# Patient Record
Sex: Female | Born: 1975 | Race: Black or African American | Hispanic: No | Marital: Married | Smoking: Never smoker
Health system: Southern US, Community
[De-identification: ages and names within clinical notes are randomized; demographics above are authoritative.]

## PROBLEM LIST (undated history)

## (undated) DIAGNOSIS — B9689 Other specified bacterial agents as the cause of diseases classified elsewhere: Secondary | ICD-10-CM

## (undated) DIAGNOSIS — I729 Aneurysm of unspecified site: Secondary | ICD-10-CM

## (undated) DIAGNOSIS — N390 Urinary tract infection, site not specified: Secondary | ICD-10-CM

## (undated) DIAGNOSIS — Q893 Situs inversus: Secondary | ICD-10-CM

## (undated) HISTORY — PX: BONE MARROW BIOPSY: SHX199

## (undated) HISTORY — PX: LYMPHADENECTOMY: SHX5960

## (undated) HISTORY — DX: Other specified bacterial agents as the cause of diseases classified elsewhere: B96.89

## (undated) HISTORY — DX: Urinary tract infection, site not specified: N39.0

---

## 2017-01-06 ENCOUNTER — Emergency Department (HOSPITAL_COMMUNITY): Payer: BC Managed Care – PPO

## 2017-01-06 ENCOUNTER — Inpatient Hospital Stay (HOSPITAL_COMMUNITY)
Admission: EM | Admit: 2017-01-06 | Discharge: 2017-01-22 | DRG: 021 | Disposition: A | Discharge status: Home / Self Care | Payer: BC Managed Care – PPO | Attending: Neurosurgery | Admitting: Neurosurgery

## 2017-01-06 DIAGNOSIS — R51 Headache: Secondary | ICD-10-CM

## 2017-01-06 DIAGNOSIS — I67848 Other cerebrovascular vasospasm and vasoconstriction: Secondary | ICD-10-CM | POA: Diagnosis not present

## 2017-01-06 DIAGNOSIS — I671 Cerebral aneurysm, nonruptured: Secondary | ICD-10-CM

## 2017-01-06 DIAGNOSIS — D62 Acute posthemorrhagic anemia: Secondary | ICD-10-CM | POA: Diagnosis present

## 2017-01-06 DIAGNOSIS — I6032 Nontraumatic subarachnoid hemorrhage from left posterior communicating artery: Secondary | ICD-10-CM | POA: Diagnosis not present

## 2017-01-06 DIAGNOSIS — I609 Nontraumatic subarachnoid hemorrhage, unspecified: Secondary | ICD-10-CM

## 2017-01-06 DIAGNOSIS — M545 Low back pain: Secondary | ICD-10-CM | POA: Diagnosis not present

## 2017-01-06 DIAGNOSIS — R402253 Coma scale, best verbal response, oriented, at hospital admission: Secondary | ICD-10-CM | POA: Diagnosis present

## 2017-01-06 DIAGNOSIS — R402143 Coma scale, eyes open, spontaneous, at hospital admission: Secondary | ICD-10-CM | POA: Diagnosis present

## 2017-01-06 DIAGNOSIS — M79606 Pain in leg, unspecified: Secondary | ICD-10-CM | POA: Diagnosis not present

## 2017-01-06 DIAGNOSIS — R519 Headache, unspecified: Secondary | ICD-10-CM

## 2017-01-06 DIAGNOSIS — Z01818 Encounter for other preprocedural examination: Secondary | ICD-10-CM

## 2017-01-06 DIAGNOSIS — J9690 Respiratory failure, unspecified, unspecified whether with hypoxia or hypercapnia: Secondary | ICD-10-CM

## 2017-01-06 DIAGNOSIS — Z79899 Other long term (current) drug therapy: Secondary | ICD-10-CM

## 2017-01-06 DIAGNOSIS — R402363 Coma scale, best motor response, obeys commands, at hospital admission: Secondary | ICD-10-CM | POA: Diagnosis present

## 2017-01-06 MED ORDER — SODIUM CHLORIDE 0.9 % IV BOLUS (SEPSIS)
1000.0000 mL | Freq: Once | INTRAVENOUS | Status: AC
  Administered 2017-01-07: 1000 mL via INTRAVENOUS

## 2017-01-06 MED ORDER — DIPHENHYDRAMINE HCL 50 MG/ML IJ SOLN
25.0000 mg | Freq: Once | INTRAMUSCULAR | Status: AC
  Administered 2017-01-07: 25 mg via INTRAVENOUS
  Filled 2017-01-06: qty 1

## 2017-01-06 MED ORDER — METOCLOPRAMIDE HCL 5 MG/ML IJ SOLN
10.0000 mg | Freq: Once | INTRAMUSCULAR | Status: AC
  Administered 2017-01-07: 10 mg via INTRAVENOUS
  Filled 2017-01-06: qty 2

## 2017-01-06 NOTE — ED Triage Notes (Signed)
Pt uncle says the pt has had headache since coming home from the gym this afternoon. Pt took antianxiety medication and tried to sleep the headache off but still had headache.

## 2017-01-06 NOTE — ED Provider Notes (Signed)
[facility]-EMERGENCY DEPT Provider Note   CSN: [id number] Arrival date & time: 01/06/17  2138  By signing my name below, I, Doreatha Martin, attest that this documentation has been prepared under the direction and in the presence of Devoria Albe, MD. Electronically Signed: Doreatha Martin, ED Scribe. 01/06/17. 11:35 PM.   Time seen 11:22 PM    History   Chief Complaint Chief Complaint  Patient presents with  . Headache    HPI Bonnie Tran is a 41 y.o. female who presents to the Emergency Department complaining of sudden onset, severe, throbbing HA that began at 5:45 PM this evening with associated nausea. Pt states her pain begins at her posterior neck and radiates through the occiput, parietal, frontal and temporal regions of her head. Per pt, she was doing shoulder presses at the gym, which she reports is a normal exercise for her however she hadn't done it in a month, when she suddenly began to experience an intense throbbing pain in her posterior neck/head. Pt states she had not done this exercise in over a month, and was straining with her normal 20 lb weights that she was doing before. She states her HA is worsened with retching, head movement, speaking, light and sound. Pt reports mild relief of pain with ice application to her posterior neck, pressure applied to her forehead, and no relief with 2 Clonopin. No h/o similar headaches or frequent headaches; no FHx of headaches. Pt is right hand dominant. She denies vomiting, blurred vision, numbness/tingling to the extremities.      PCPDarrow Bussing, MD with Deboraha Sprang   The history is provided by the patient. No language interpreter was used.    No past medical history on file.  There are no active problems to display for this patient.   No past surgical history on file.  OB History    No data available       Home Medications    Prior to Admission medications   Medication Sig Start Date End Date Taking? Authorizing Provider    amphetamine-dextroamphetamine (ADDERALL) 10 MG tablet Take 10 mg by mouth 2 (two) times daily with a meal.   Yes Historical Provider, MD  citalopram (CELEXA) 10 MG tablet Take 10 mg by mouth daily.   Yes Historical Provider, MD  clonazePAM (KLONOPIN) 1 MG tablet Take 0.5-1 mg by mouth at bedtime as needed for anxiety.   Yes Historical Provider, MD    Family History No family history on file.  Social History Social History  Substance Use Topics  . Smoking status: Not on file  . Smokeless tobacco: Not on file  . Alcohol use Not on file  lives with her uncle No smoking No alcohol   Allergies   Patient has no known allergies.   Review of Systems Review of Systems  Eyes: Positive for photophobia. Negative for visual disturbance.  Gastrointestinal: Positive for nausea. Negative for vomiting.  Musculoskeletal: Positive for neck pain.  Neurological: Positive for headaches. Negative for numbness.       No tingling  All other systems reviewed and are negative.   Physical Exam Updated Vital Signs BP 128/68 (BP Location: Right Arm)   Pulse 81   Temp 97.5 F (36.4 C) (Oral)   Resp 18   SpO2 97%  Vital signs normal     Physical Exam  Constitutional: She is oriented to person, place, and time. She appears well-developed and well-nourished.  Non-toxic appearance. She does not appear ill. She appears distressed.  Appears photophobic.  HENT:  Head: Normocephalic and atraumatic.  Right Ear: External ear normal.  Left Ear: External ear normal.  Nose: Nose normal. No mucosal edema or rhinorrhea.  Mouth/Throat: Oropharynx is clear and moist and mucous membranes are normal. No dental abscesses or uvula swelling.  Eyes: Conjunctivae and EOM are normal. Pupils are equal, round, and reactive to light.  Neck: Normal range of motion and full passive range of motion without pain. Neck supple. Muscular tenderness present.  Non-tender cervical spine. Has b/l trapezius tenderness.    Cardiovascular: Normal rate, regular rhythm and normal heart sounds.  Exam reveals no gallop and no friction rub.   No murmur heard. Pulmonary/Chest: Effort normal and breath sounds normal. No respiratory distress. She has no wheezes. She has no rhonchi. She has no rales. She exhibits no tenderness and no crepitus.  Abdominal: Soft. Normal appearance and bowel sounds are normal. She exhibits no distension. There is no tenderness. There is no rebound and no guarding.  Musculoskeletal: Normal range of motion. She exhibits no edema or tenderness.  Moves all extremities well.   Neurological: She is alert and oriented to person, place, and time. She has normal strength. No cranial nerve deficit.  Equal grips.   Skin: Skin is warm, dry and intact. No rash noted. No erythema. No pallor.  Psychiatric: She has a normal mood and affect. Her speech is normal and behavior is normal. Her mood appears not anxious.  Nursing note and vitals reviewed.    ED Treatments / Results  Labs (all labs ordered are listed, but only abnormal results are displayed) Labs Reviewed - No data to display  EKG  EKG Interpretation None       Radiology Ct Angio Head W Or Wo Contrast  Result Date: 01/07/2017 CLINICAL DATA:  Subarachnoid hemorrhage EXAM: CT ANGIOGRAPHY HEAD TECHNIQUE: Multidetector CT imaging of the head was performed using the standard protocol during bolus administration of intravenous contrast. Multiplanar CT image reconstructions and MIPs were obtained to evaluate the vascular anatomy. 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 This is at the origin of the ophthalmic artery. The right ICA is normal. --Anterior cerebral arteries: There is a persistent median callosal artery, a normal variant. Otherwise normal.  --Middle cerebral arteries: Normal. --Posterior communicating arteries: Present bilaterally. Posterior circulation: --Posterior cerebral arteries: Normal. --Superior cerebellar arteries: Normal. --Basilar artery: Normal. --Anterior inferior cerebellar arteries: Normal. --Posterior inferior cerebellar arteries: Normal. Venous sinuses: As permitted by contrast timing, patent. Anatomic variants: None Delayed phase: Vascular enhancement obscures assessment of subtle subarachnoid blood in the sylvian fissures seen on the previous study. Review of the MIP images confirms the above findings IMPRESSION: 1. 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 2. Otherwise normal intracranial arteries. Electronically Signed   By: Deatra Robinson M.D.   On: 01/07/2017 01:00   Ct Head Wo Contrast  Result Date: 01/07/2017 CLINICAL DATA:  Acute onset of headache.  Initial encounter. EXAM: CT HEAD WITHOUT CONTRAST TECHNIQUE: Contiguous axial images were obtained from the base of the skull through the vertex without intravenous contrast. COMPARISON:  None. FINDINGS: Brain: Acute subarachnoid hemorrhage is seen tracking along the sylvian fissures bilaterally, and is suggested minimally about the circle of Willis. This raises suspicion for a ruptured aneurysm, possibly at  the anterior communicating artery. The posterior fossa, including the cerebellum, brainstem and fourth ventricle, is within normal limits. The third and lateral ventricles, and basal ganglia are unremarkable in appearance. The cerebral hemispheres are symmetric in appearance, with normal gray-white differentiation. No midline shift is seen. Vascular: No hyperdense vessel or unexpected calcification. Skull: There is no evidence of fracture; visualized osseous structures are unremarkable in appearance. Sinuses/Orbits: The orbits are within normal limits. The paranasal sinuses and mastoid air cells are  well-aerated. Other: No significant soft tissue abnormalities are seen. IMPRESSION: Acute subarachnoid hemorrhage tracking along the sylvian fissures bilaterally, and minimally about the circle of Willis. This raises suspicion for ruptured aneurysm, possibly at the anterior communicating artery. CTA of the head is recommended for further evaluation. Critical Value/emergent results were called by telephone at the time of interpretation on 01/07/2017 at 12:05 am to Dr. Devoria Albe, who verbally acknowledged these results. Electronically Signed   By: Roanna Raider M.D.   On: 01/07/2017 00:09    Procedures Procedures (including critical care time)  CRITICAL CARE Performed by: Jordyan Hardiman L Renan Danese Total critical care time: 37  minutes Critical care time was exclusive of separately billable procedures and treating other patients. Critical care was necessary to treat or prevent imminent or life-threatening deterioration. Critical care was time spent personally by me on the following activities: development of treatment plan with patient and/or surrogate as well as nursing, discussions with consultants, evaluation of patient's response to treatment, examination of patient, obtaining history from patient or surrogate, ordering and performing treatments and interventions, ordering and review of laboratory studies, ordering and review of radiographic studies, pulse oximetry and re-evaluation of patient's condition.   Medications Ordered in ED Medications  iopamidol (ISOVUE-370) 76 % injection (not administered)  sodium chloride 0.9 % bolus 1,000 mL (1,000 mLs Intravenous New Bag/Given 01/07/17 0002)  metoCLOPramide (REGLAN) injection 10 mg (10 mg Intravenous Given 01/07/17 0002)  diphenhydrAMINE (BENADRYL) injection 25 mg (25 mg Intravenous Given 01/07/17 0005)  fentaNYL (SUBLIMAZE) injection 50 mcg (50 mcg Intravenous Given 01/07/17 0042)  iopamidol (ISOVUE-370) 76 % injection 100 mL (100 mLs Intravenous Contrast Given  01/07/17 0026)     Initial Impression / Assessment and Plan / ED Course  I have reviewed the triage vital signs and the nursing notes.  Pertinent labs & imaging results that were available during my care of the patient were reviewed by me and considered in my medical decision making (see chart for details).     DIAGNOSTIC STUDIES: Oxygen Saturation is 97% on RA, normal by my interpretation.    COORDINATION OF CARE: 11:33 PM Discussed treatment plan with pt at bedside which includes CT head,  and pt agreed to plan.Patient who has no history of prior headaches presents tonight with acute onset of posterior neck and headache that is now being mainly whole cranial with extreme light, noise, and sensitivity to movement. Concern was for subarachnoid hemorrhage, head CT was ordered. She was given medications for pain.  12:07 AM radiologist called her CT report. CTA of the brain was ordered.  12:10 AM I discussed her test results with patient and her family. They understand the need for the CT scan with contrast.  1:07 AM I have reviewed her report for her CTA. Consult to neurosurgery was done.  1:40 AM Family updated on status of likely admission and transfer to [facility]. Neurosurgery consult still pending. Pts pain has improved at this time and she is sleeping comfortably.   1:59 AM Consulted  with neurosurgeon, Dr. Venetia MaxonStern, who accepts her [facility].   2:12 AM Pt and family informed that she is being transferred. They are agreeable to admission and transfer.    Final Clinical Impressions(s) / ED Diagnoses   Final diagnoses:  SAH (subarachnoid hemorrhage) (HCC)  Acute headache    [facility]   I personally performed the services described in this documentation, which was scribed in my presence. The recorded information has been reviewed and considered.  Devoria AlbeIva Mishel Sans, MD, Concha PyoFACEP    Haji Delaine, MD 01/07/17 [phone]

## 2017-01-07 ENCOUNTER — Inpatient Hospital Stay (HOSPITAL_COMMUNITY): Payer: BC Managed Care – PPO

## 2017-01-07 ENCOUNTER — Emergency Department (HOSPITAL_COMMUNITY): Payer: BC Managed Care – PPO

## 2017-01-07 ENCOUNTER — Encounter (HOSPITAL_COMMUNITY)
Admission: EM | Disposition: A | Discharge status: Home / Self Care | Payer: Self-pay | Source: Home / Self Care | Attending: Neurosurgery

## 2017-01-07 ENCOUNTER — Inpatient Hospital Stay (HOSPITAL_COMMUNITY): Payer: BC Managed Care – PPO | Admitting: Anesthesiology

## 2017-01-07 ENCOUNTER — Inpatient Hospital Stay (HOSPITAL_COMMUNITY)
Admission: EM | Disposition: A | Discharge status: Home / Self Care | Payer: Self-pay | Source: Home / Self Care | Attending: Neurosurgery

## 2017-01-07 ENCOUNTER — Encounter (HOSPITAL_COMMUNITY): Payer: Self-pay | Admitting: Certified Registered"

## 2017-01-07 DIAGNOSIS — I671 Cerebral aneurysm, nonruptured: Secondary | ICD-10-CM | POA: Diagnosis not present

## 2017-01-07 DIAGNOSIS — I67848 Other cerebrovascular vasospasm and vasoconstriction: Secondary | ICD-10-CM | POA: Diagnosis not present

## 2017-01-07 DIAGNOSIS — R402253 Coma scale, best verbal response, oriented, at hospital admission: Secondary | ICD-10-CM | POA: Diagnosis present

## 2017-01-07 DIAGNOSIS — Z79899 Other long term (current) drug therapy: Secondary | ICD-10-CM | POA: Diagnosis not present

## 2017-01-07 DIAGNOSIS — R51 Headache: Secondary | ICD-10-CM | POA: Diagnosis present

## 2017-01-07 DIAGNOSIS — R402363 Coma scale, best motor response, obeys commands, at hospital admission: Secondary | ICD-10-CM | POA: Diagnosis present

## 2017-01-07 DIAGNOSIS — I609 Nontraumatic subarachnoid hemorrhage, unspecified: Secondary | ICD-10-CM | POA: Diagnosis not present

## 2017-01-07 DIAGNOSIS — R402143 Coma scale, eyes open, spontaneous, at hospital admission: Secondary | ICD-10-CM | POA: Diagnosis present

## 2017-01-07 DIAGNOSIS — M545 Low back pain: Secondary | ICD-10-CM | POA: Diagnosis not present

## 2017-01-07 DIAGNOSIS — J9601 Acute respiratory failure with hypoxia: Secondary | ICD-10-CM | POA: Diagnosis not present

## 2017-01-07 DIAGNOSIS — D62 Acute posthemorrhagic anemia: Secondary | ICD-10-CM | POA: Diagnosis present

## 2017-01-07 DIAGNOSIS — M79606 Pain in leg, unspecified: Secondary | ICD-10-CM | POA: Diagnosis not present

## 2017-01-07 DIAGNOSIS — I6032 Nontraumatic subarachnoid hemorrhage from left posterior communicating artery: Secondary | ICD-10-CM | POA: Diagnosis present

## 2017-01-07 DIAGNOSIS — J9600 Acute respiratory failure, unspecified whether with hypoxia or hypercapnia: Secondary | ICD-10-CM | POA: Diagnosis not present

## 2017-01-07 HISTORY — PX: IR GENERIC HISTORICAL: IMG1180011

## 2017-01-07 HISTORY — PX: RADIOLOGY WITH ANESTHESIA: SHX6223

## 2017-01-07 HISTORY — PX: CRANIOTOMY: SHX93

## 2017-01-07 LAB — CBC WITH DIFFERENTIAL/PLATELET
BASOS ABS: 0 10*3/uL (ref 0.0–0.1)
Basophils Relative: 0 %
EOS ABS: 0 10*3/uL (ref 0.0–0.7)
EOS PCT: 0 %
HEMATOCRIT: 30.9 % — AB (ref 36.0–46.0)
HEMOGLOBIN: 10.5 g/dL — AB (ref 12.0–15.0)
LYMPHS ABS: 0.7 10*3/uL (ref 0.7–4.0)
Lymphocytes Relative: 7 %
MCH: 29.7 pg (ref 26.0–34.0)
MCHC: 34 g/dL (ref 30.0–36.0)
MCV: 87.5 fL (ref 78.0–100.0)
MONOS PCT: 4 %
Monocytes Absolute: 0.3 10*3/uL (ref 0.1–1.0)
NEUTROS PCT: 89 %
Neutro Abs: 8.6 10*3/uL — ABNORMAL HIGH (ref 1.7–7.7)
PLATELETS: 160 10*3/uL (ref 150–400)
RBC: 3.53 MIL/uL — AB (ref 3.87–5.11)
RDW: 12.8 % (ref 11.5–15.5)
WBC: 9.6 10*3/uL (ref 4.0–10.5)

## 2017-01-07 LAB — COMPREHENSIVE METABOLIC PANEL
ALK PHOS: 28 U/L — AB (ref 38–126)
ALT: 59 U/L — AB (ref 14–54)
AST: 80 U/L — AB (ref 15–41)
Albumin: 3.6 g/dL (ref 3.5–5.0)
Anion gap: 5 (ref 5–15)
BUN: 9 mg/dL (ref 6–20)
CALCIUM: 8.6 mg/dL — AB (ref 8.9–10.3)
CHLORIDE: 105 mmol/L (ref 101–111)
CO2: 24 mmol/L (ref 22–32)
CREATININE: 0.66 mg/dL (ref 0.44–1.00)
GFR calc Af Amer: >60 mL/min (ref >60)
Glucose, Bld: 102 mg/dL — ABNORMAL HIGH (ref 65–99)
Potassium: 3.9 mmol/L (ref 3.5–5.1)
Sodium: 134 mmol/L — ABNORMAL LOW (ref 135–145)
Total Bilirubin: 1 mg/dL (ref 0.3–1.2)
Total Protein: 6.5 g/dL (ref 6.5–8.1)

## 2017-01-07 LAB — TYPE AND SCREEN
ABO/RH(D): B POS
Antibody Screen: NEGATIVE

## 2017-01-07 LAB — MRSA PCR SCREENING: MRSA BY PCR: NEGATIVE

## 2017-01-07 LAB — ABO/RH: ABO/RH(D): B POS

## 2017-01-07 LAB — HIV ANTIBODY (ROUTINE TESTING W REFLEX): HIV SCREEN 4TH GENERATION: NONREACTIVE

## 2017-01-07 SURGERY — CRANIOTOMY INTRACRANIAL ANEURYSM FOR CAROTID
Anesthesia: General | Site: Head | Laterality: Left

## 2017-01-07 SURGERY — RADIOLOGY WITH ANESTHESIA
Anesthesia: General

## 2017-01-07 MED ORDER — FENTANYL CITRATE (PF) 100 MCG/2ML IJ SOLN
INTRAMUSCULAR | Status: DC | PRN
  Administered 2017-01-07: 100 ug via INTRAVENOUS

## 2017-01-07 MED ORDER — THROMBIN 5000 UNITS EX SOLR
OROMUCOSAL | Status: DC | PRN
  Administered 2017-01-07: 5 mL via TOPICAL

## 2017-01-07 MED ORDER — MICROFIBRILLAR COLL HEMOSTAT EX POWD
CUTANEOUS | Status: AC
  Filled 2017-01-07: qty 5

## 2017-01-07 MED ORDER — LIDOCAINE HCL (PF) 1 % IJ SOLN
INTRAMUSCULAR | Status: DC | PRN
  Administered 2017-01-07: 10 mL via INTRADERMAL

## 2017-01-07 MED ORDER — SURGIFOAM 100 EX MISC
CUTANEOUS | Status: DC | PRN
  Administered 2017-01-07: 20 mL via TOPICAL

## 2017-01-07 MED ORDER — FENTANYL CITRATE (PF) 100 MCG/2ML IJ SOLN
INTRAMUSCULAR | Status: DC | PRN
  Administered 2017-01-07 (×6): 100 ug via INTRAVENOUS

## 2017-01-07 MED ORDER — SODIUM CHLORIDE 0.9 % IR SOLN
Status: DC | PRN
  Administered 2017-01-07: 500 mL

## 2017-01-07 MED ORDER — SENNOSIDES-DOCUSATE SODIUM 8.6-50 MG PO TABS
1.0000 | ORAL_TABLET | Freq: Two times a day (BID) | ORAL | Status: DC
  Administered 2017-01-08 – 2017-01-22 (×20): 1 via ORAL
  Filled 2017-01-07 (×23): qty 1

## 2017-01-07 MED ORDER — SODIUM CHLORIDE 0.9 % IV SOLN
INTRAVENOUS | Status: DC | PRN
  Administered 2017-01-07: 18:00:00 via INTRAVENOUS

## 2017-01-07 MED ORDER — PHENYLEPHRINE HCL 10 MG/ML IJ SOLN
INTRAVENOUS | Status: DC | PRN
  Administered 2017-01-07: 15 ug/min via INTRAVENOUS

## 2017-01-07 MED ORDER — IOPAMIDOL (ISOVUE-370) INJECTION 76%
100.0000 mL | Freq: Once | INTRAVENOUS | Status: AC | PRN
  Administered 2017-01-07: 100 mL via INTRAVENOUS

## 2017-01-07 MED ORDER — 0.9 % SODIUM CHLORIDE (POUR BTL) OPTIME
TOPICAL | Status: DC | PRN
  Administered 2017-01-07 (×2): 1000 mL

## 2017-01-07 MED ORDER — THROMBIN 5000 UNITS EX SOLR
CUTANEOUS | Status: AC
  Filled 2017-01-07: qty 5000

## 2017-01-07 MED ORDER — MORPHINE SULFATE (PF) 2 MG/ML IV SOLN
1.0000 mg | INTRAVENOUS | Status: DC | PRN
Start: 2017-01-07 — End: 2017-01-07
  Administered 2017-01-07 (×4): 2 mg via INTRAVENOUS
  Filled 2017-01-07 (×4): qty 1

## 2017-01-07 MED ORDER — FENTANYL CITRATE (PF) 100 MCG/2ML IJ SOLN
50.0000 ug | Freq: Once | INTRAMUSCULAR | Status: AC
  Administered 2017-01-07: 50 ug via INTRAVENOUS
  Filled 2017-01-07: qty 2

## 2017-01-07 MED ORDER — PROPOFOL 10 MG/ML IV BOLUS
INTRAVENOUS | Status: DC | PRN
  Administered 2017-01-07: 100 mg via INTRAVENOUS

## 2017-01-07 MED ORDER — MORPHINE SULFATE (PF) 2 MG/ML IV SOLN
1.0000 mg | INTRAVENOUS | Status: DC | PRN
  Administered 2017-01-07 (×2): 2 mg via INTRAVENOUS
  Administered 2017-01-08: 1 mg via INTRAVENOUS
  Administered 2017-01-08 – 2017-01-10 (×12): 2 mg via INTRAVENOUS
  Administered 2017-01-11: 1 mg via INTRAVENOUS
  Filled 2017-01-07 (×7): qty 1
  Filled 2017-01-07: qty 2
  Filled 2017-01-07 (×7): qty 1
  Filled 2017-01-07: qty 2

## 2017-01-07 MED ORDER — PROPOFOL 10 MG/ML IV BOLUS
INTRAVENOUS | Status: DC | PRN
  Administered 2017-01-07: 150 mg via INTRAVENOUS

## 2017-01-07 MED ORDER — LIDOCAINE HCL (PF) 1 % IJ SOLN
INTRAMUSCULAR | Status: AC
  Filled 2017-01-07: qty 30

## 2017-01-07 MED ORDER — STROKE: EARLY STAGES OF RECOVERY BOOK
Freq: Once | Status: AC
  Administered 2017-01-07: 07:00:00
  Filled 2017-01-07: qty 1

## 2017-01-07 MED ORDER — FENTANYL CITRATE (PF) 100 MCG/2ML IJ SOLN
INTRAMUSCULAR | Status: AC
  Filled 2017-01-07: qty 4

## 2017-01-07 MED ORDER — PROPOFOL 1000 MG/100ML IV EMUL
0.0000 ug/kg/min | INTRAVENOUS | Status: DC
  Administered 2017-01-08: 50 ug/kg/min via INTRAVENOUS
  Administered 2017-01-08: 25 ug/kg/min via INTRAVENOUS
  Administered 2017-01-08: 50 ug/kg/min via INTRAVENOUS
  Filled 2017-01-07 (×2): qty 100

## 2017-01-07 MED ORDER — CLONAZEPAM 0.5 MG PO TABS
0.5000 mg | ORAL_TABLET | Freq: Every evening | ORAL | Status: DC | PRN
  Administered 2017-01-16: 1 mg via ORAL
  Administered 2017-01-17: 0.5 mg via ORAL
  Administered 2017-01-18 – 2017-01-21 (×4): 1 mg via ORAL
  Filled 2017-01-07 (×4): qty 2
  Filled 2017-01-07: qty 1
  Filled 2017-01-07: qty 2

## 2017-01-07 MED ORDER — BACITRACIN ZINC 500 UNIT/GM EX OINT
TOPICAL_OINTMENT | CUTANEOUS | Status: AC
  Filled 2017-01-07: qty 28.35

## 2017-01-07 MED ORDER — BACITRACIN ZINC 500 UNIT/GM EX OINT
TOPICAL_OINTMENT | CUTANEOUS | Status: DC | PRN
  Administered 2017-01-07: 1 via TOPICAL

## 2017-01-07 MED ORDER — FENTANYL CITRATE (PF) 100 MCG/2ML IJ SOLN: 100.0000 ug | INTRAMUSCULAR | Status: DC | PRN

## 2017-01-07 MED ORDER — IOPAMIDOL (ISOVUE-370) INJECTION 76%
INTRAVENOUS | Status: AC
  Filled 2017-01-07: qty 100

## 2017-01-07 MED ORDER — FAMOTIDINE IN NACL 20-0.9 MG/50ML-% IV SOLN
20.0000 mg | Freq: Two times a day (BID) | INTRAVENOUS | Status: DC
  Administered 2017-01-07 – 2017-01-12 (×11): 20 mg via INTRAVENOUS
  Filled 2017-01-07 (×11): qty 50

## 2017-01-07 MED ORDER — PHENYLEPHRINE HCL 10 MG/ML IJ SOLN
INTRAVENOUS | Status: DC | PRN
  Administered 2017-01-07: 25 ug/min via INTRAVENOUS

## 2017-01-07 MED ORDER — ACETAMINOPHEN 160 MG/5ML PO SOLN
650.0000 mg | ORAL | Status: DC | PRN
  Administered 2017-01-08: 650 mg
  Filled 2017-01-07: qty 20.3

## 2017-01-07 MED ORDER — INDOCYANINE GREEN 25 MG IV SOLR
25.0000 mg | Freq: Once | INTRAVENOUS | Status: AC
Start: 2017-01-07 — End: 2017-01-07
  Administered 2017-01-07: 12.5 mg via INTRAVENOUS
  Filled 2017-01-07: qty 25

## 2017-01-07 MED ORDER — LABETALOL HCL 5 MG/ML IV SOLN: 20.0000 mg | Freq: Once | INTRAVENOUS | Status: DC

## 2017-01-07 MED ORDER — CHLORHEXIDINE GLUCONATE 0.12% ORAL RINSE (MEDLINE KIT)
15.0000 mL | Freq: Two times a day (BID) | OROMUCOSAL | Status: DC
  Administered 2017-01-07 – 2017-01-08 (×2): 15 mL via OROMUCOSAL

## 2017-01-07 MED ORDER — PROPOFOL 500 MG/50ML IV EMUL
INTRAVENOUS | Status: DC | PRN
  Administered 2017-01-07: 100 ug/kg/min via INTRAVENOUS

## 2017-01-07 MED ORDER — SODIUM CHLORIDE 0.9 % IV SOLN
500.0000 mg | Freq: Two times a day (BID) | INTRAVENOUS | Status: DC
  Administered 2017-01-07 – 2017-01-21 (×28): 500 mg via INTRAVENOUS
  Filled 2017-01-07 (×31): qty 5

## 2017-01-07 MED ORDER — NIMODIPINE 60 MG/20ML PO SOLN
60.0000 mg | ORAL | Status: DC
  Administered 2017-01-07 – 2017-01-19 (×6): 60 mg
  Filled 2017-01-07 (×7): qty 20

## 2017-01-07 MED ORDER — LIDOCAINE HCL (CARDIAC) 20 MG/ML IV SOLN
INTRAVENOUS | Status: DC | PRN
  Administered 2017-01-07: 60 mg via INTRAVENOUS

## 2017-01-07 MED ORDER — VECURONIUM BROMIDE 10 MG IV SOLR
INTRAVENOUS | Status: DC | PRN
  Administered 2017-01-07 (×3): 5 mg via INTRAVENOUS

## 2017-01-07 MED ORDER — PROPOFOL 1000 MG/100ML IV EMUL: 0.0000 ug/kg/min | INTRAVENOUS | Status: DC

## 2017-01-07 MED ORDER — SODIUM CHLORIDE 0.9 % IV SOLN
1000.0000 mg | Freq: Once | INTRAVENOUS | Status: AC
  Administered 2017-01-07: 1000 mg via INTRAVENOUS
  Filled 2017-01-07: qty 10

## 2017-01-07 MED ORDER — POTASSIUM CHLORIDE IN NACL 20-0.9 MEQ/L-% IV SOLN
INTRAVENOUS | Status: DC
  Administered 2017-01-07: 05:00:00 via INTRAVENOUS
  Administered 2017-01-08: 1000 mL via INTRAVENOUS
  Administered 2017-01-08: 15:00:00 via INTRAVENOUS
  Administered 2017-01-09: 1000 mL via INTRAVENOUS
  Administered 2017-01-09 – 2017-01-10 (×4): via INTRAVENOUS
  Administered 2017-01-10: 1000 mL via INTRAVENOUS
  Administered 2017-01-11 – 2017-01-14 (×8): via INTRAVENOUS
  Administered 2017-01-14 – 2017-01-16 (×4): 1000 mL via INTRAVENOUS
  Administered 2017-01-16: 150 mL/h via INTRAVENOUS
  Administered 2017-01-17: 20:00:00 via INTRAVENOUS
  Administered 2017-01-17: 1000 mL via INTRAVENOUS
  Administered 2017-01-17: 150 mL/h via INTRAVENOUS
  Administered 2017-01-18 – 2017-01-21 (×12): via INTRAVENOUS
  Filled 2017-01-07 (×46): qty 1000

## 2017-01-07 MED ORDER — ROCURONIUM BROMIDE 50 MG/5ML IV SOSY
PREFILLED_SYRINGE | INTRAVENOUS | Status: AC
  Filled 2017-01-07: qty 10

## 2017-01-07 MED ORDER — ROCURONIUM BROMIDE 100 MG/10ML IV SOLN
INTRAVENOUS | Status: DC | PRN
  Administered 2017-01-07 (×2): 50 mg via INTRAVENOUS

## 2017-01-07 MED ORDER — ORAL CARE MOUTH RINSE
15.0000 mL | Freq: Four times a day (QID) | OROMUCOSAL | Status: DC
  Administered 2017-01-08 – 2017-01-09 (×5): 15 mL via OROMUCOSAL

## 2017-01-07 MED ORDER — DEXAMETHASONE SODIUM PHOSPHATE 10 MG/ML IJ SOLN
INTRAMUSCULAR | Status: DC | PRN
  Administered 2017-01-07: 10 mg via INTRAVENOUS

## 2017-01-07 MED ORDER — MANNITOL 25 % IV SOLN
12.5000 g | Freq: Once | INTRAVENOUS | Status: AC
  Administered 2017-01-07: 25 g via INTRAVENOUS
  Filled 2017-01-07: qty 50

## 2017-01-07 MED ORDER — ACETAMINOPHEN 325 MG PO TABS
650.0000 mg | ORAL_TABLET | ORAL | Status: DC | PRN
  Administered 2017-01-12 – 2017-01-21 (×3): 650 mg via ORAL
  Filled 2017-01-07 (×3): qty 2

## 2017-01-07 MED ORDER — PANTOPRAZOLE SODIUM 40 MG IV SOLR: 40.0000 mg | Freq: Every day | INTRAVENOUS | Status: DC

## 2017-01-07 MED ORDER — ARTIFICIAL TEARS OP OINT
TOPICAL_OINTMENT | OPHTHALMIC | Status: DC | PRN
  Administered 2017-01-07: 1 via OPHTHALMIC

## 2017-01-07 MED ORDER — IOPAMIDOL (ISOVUE-300) INJECTION 61%
INTRAVENOUS | Status: AC
  Administered 2017-01-07: 30 mL
  Filled 2017-01-07: qty 150

## 2017-01-07 MED ORDER — THROMBIN 20000 UNITS EX SOLR
CUTANEOUS | Status: AC
  Filled 2017-01-07: qty 20000

## 2017-01-07 MED ORDER — ACETAMINOPHEN 650 MG RE SUPP: 650.0000 mg | RECTAL | Status: DC | PRN

## 2017-01-07 MED ORDER — ALBUMIN HUMAN 5 % IV SOLN
INTRAVENOUS | Status: DC | PRN
  Administered 2017-01-07 (×2): via INTRAVENOUS

## 2017-01-07 MED ORDER — SODIUM CHLORIDE 0.9 % IV SOLN
INTRAVENOUS | Status: DC | PRN
  Administered 2017-01-07: 17:00:00 via INTRAVENOUS

## 2017-01-07 MED ORDER — SODIUM CHLORIDE 0.9 % IV SOLN
INTRAVENOUS | Status: DC | PRN
  Administered 2017-01-07: 19:00:00 via INTRAVENOUS

## 2017-01-07 MED ORDER — CEFAZOLIN SODIUM-DEXTROSE 2-3 GM-% IV SOLR
INTRAVENOUS | Status: DC | PRN
  Administered 2017-01-07: 2 g via INTRAVENOUS

## 2017-01-07 MED ORDER — BUPIVACAINE HCL (PF) 0.5 % IJ SOLN
INTRAMUSCULAR | Status: AC
  Filled 2017-01-07: qty 30

## 2017-01-07 MED ORDER — PROPOFOL 500 MG/50ML IV EMUL
INTRAVENOUS | Status: DC | PRN
  Administered 2017-01-07: 50 ug/kg/min via INTRAVENOUS

## 2017-01-07 MED ORDER — FENTANYL CITRATE (PF) 100 MCG/2ML IJ SOLN
100.0000 ug | INTRAMUSCULAR | Status: DC | PRN
  Administered 2017-01-08: 100 ug via INTRAVENOUS
  Filled 2017-01-07 (×2): qty 2

## 2017-01-07 MED ORDER — CITALOPRAM HYDROBROMIDE 10 MG PO TABS
10.0000 mg | ORAL_TABLET | Freq: Every day | ORAL | Status: DC
  Administered 2017-01-08 – 2017-01-22 (×15): 10 mg via ORAL
  Filled 2017-01-07 (×15): qty 1

## 2017-01-07 MED ORDER — PHENYLEPHRINE HCL 10 MG/ML IJ SOLN
INTRAMUSCULAR | Status: DC | PRN
  Administered 2017-01-07 (×2): 80 ug via INTRAVENOUS

## 2017-01-07 MED ORDER — NICARDIPINE HCL IN NACL 20-0.86 MG/200ML-% IV SOLN: 0.0000 mg/h | INTRAVENOUS | Status: DC

## 2017-01-07 MED ORDER — ACETAMINOPHEN-CODEINE #3 300-30 MG PO TABS
1.0000 | ORAL_TABLET | ORAL | Status: DC | PRN
  Administered 2017-01-08: 2 via ORAL
  Administered 2017-01-08: 1 via ORAL
  Administered 2017-01-09 – 2017-01-10 (×5): 2 via ORAL
  Filled 2017-01-07 (×4): qty 2
  Filled 2017-01-07: qty 1
  Filled 2017-01-07 (×3): qty 2

## 2017-01-07 MED ORDER — LIDOCAINE HCL (PF) 1 % IJ SOLN
INTRAMUSCULAR | Status: AC
Start: 2017-01-07 — End: 2017-01-08
  Filled 2017-01-07: qty 30

## 2017-01-07 MED ORDER — MANNITOL 25 % IV SOLN
12.5000 g | Freq: Once | INTRAVENOUS | Status: DC
  Filled 2017-01-07: qty 50

## 2017-01-07 MED ORDER — NIMODIPINE 30 MG PO CAPS
60.0000 mg | ORAL_CAPSULE | ORAL | Status: DC
  Administered 2017-01-08 – 2017-01-22 (×82): 60 mg via ORAL
  Filled 2017-01-07 (×84): qty 2

## 2017-01-07 SURGICAL SUPPLY — 103 items
BAG DECANTER FOR FLEXI CONT (MISCELLANEOUS) ×3 IMPLANT
BANDAGE GAUZE 4  KLING STR (GAUZE/BANDAGES/DRESSINGS) ×3 IMPLANT
BATTERY IQ STERILE (MISCELLANEOUS) ×3 IMPLANT
BENZOIN TINCTURE PRP APPL 2/3 (GAUZE/BANDAGES/DRESSINGS) IMPLANT
BIT DRILL WIRE PASS 1.3MM (BIT) IMPLANT
BLADE SAW GIGLI 16 STRL (MISCELLANEOUS) IMPLANT
BLADE SURG 15 STRL LF DISP TIS (BLADE) ×1 IMPLANT
BLADE SURG 15 STRL SS (BLADE) ×2
BLADE ULTRA TIP 2M (BLADE) IMPLANT
BNDG GAUZE ELAST 4 BULKY (GAUZE/BANDAGES/DRESSINGS) ×6 IMPLANT
BUR ACORN 6.0 PRECISION (BURR) IMPLANT
BUR ACORN 6.0MM PRECISION (BURR)
BUR MATCHSTICK NEURO 3.0 LAGG (BURR) IMPLANT
BUR PRECISION FLUTE 5.0 (BURR) ×3 IMPLANT
BUR ROUND FLUTED 4 SOFT TCH (BURR) IMPLANT
BUR ROUND FLUTED 4MM SOFT TCH (BURR)
BUR SPIRAL ROUTER 2.3 (BUR) IMPLANT
BUR SPIRAL ROUTER 2.3MM (BUR)
CANISTER SUCT 3000ML PPV (MISCELLANEOUS) ×3 IMPLANT
CARTRIDGE OIL MAESTRO DRILL (MISCELLANEOUS) ×1 IMPLANT
CLIP ANEURY TI PERM STD 8.3 (Clip) ×3 IMPLANT
CLIP TI MEDIUM 6 (CLIP) IMPLANT
COVER MAYO STAND STRL (DRAPES) IMPLANT
DECANTER SPIKE VIAL GLASS SM (MISCELLANEOUS) ×3 IMPLANT
DIFFUSER DRILL AIR PNEUMATIC (MISCELLANEOUS) ×3 IMPLANT
DRAPE MICROSCOPE LEICA (MISCELLANEOUS) ×3 IMPLANT
DRAPE NEUROLOGICAL W/INCISE (DRAPES) ×3 IMPLANT
DRAPE WARM FLUID 44X44 (DRAPE) ×3 IMPLANT
DRILL WIRE PASS 1.3MM (BIT)
DRSG ADAPTIC 3X8 NADH LF (GAUZE/BANDAGES/DRESSINGS) ×6 IMPLANT
DRSG TELFA 3X8 NADH (GAUZE/BANDAGES/DRESSINGS) ×3 IMPLANT
DURAMATRIX ONLAY 2X2 (Neuro Prosthesis/Implant) ×3 IMPLANT
DURAPREP 6ML APPLICATOR 50/CS (WOUND CARE) ×3 IMPLANT
ELECT REM PT RETURN 9FT ADLT (ELECTROSURGICAL) ×3
ELECTRODE REM PT RTRN 9FT ADLT (ELECTROSURGICAL) ×1 IMPLANT
EVACUATOR SILICONE 100CC (DRAIN) IMPLANT
FORCEPS BIPOLAR SPETZLER 8 1.0 (NEUROSURGERY SUPPLIES) ×3 IMPLANT
GAUZE SPONGE 4X4 12PLY STRL (GAUZE/BANDAGES/DRESSINGS) IMPLANT
GAUZE SPONGE 4X4 16PLY XRAY LF (GAUZE/BANDAGES/DRESSINGS) IMPLANT
GLOVE BIO SURGEON STRL SZ 6.5 (GLOVE) ×2 IMPLANT
GLOVE BIO SURGEON STRL SZ7 (GLOVE) ×3 IMPLANT
GLOVE BIO SURGEONS STRL SZ 6.5 (GLOVE) ×1
GLOVE BIOGEL PI IND STRL 7.0 (GLOVE) IMPLANT
GLOVE BIOGEL PI IND STRL 7.5 (GLOVE) ×2 IMPLANT
GLOVE BIOGEL PI INDICATOR 7.0 (GLOVE)
GLOVE BIOGEL PI INDICATOR 7.5 (GLOVE) ×4
GLOVE ECLIPSE 6.5 STRL STRAW (GLOVE) ×3 IMPLANT
GLOVE ECLIPSE 7.0 STRL STRAW (GLOVE) ×6 IMPLANT
GOWN STRL REUS W/ TWL LRG LVL3 (GOWN DISPOSABLE) ×2 IMPLANT
GOWN STRL REUS W/ TWL XL LVL3 (GOWN DISPOSABLE) ×4 IMPLANT
GOWN STRL REUS W/TWL 2XL LVL3 (GOWN DISPOSABLE) IMPLANT
GOWN STRL REUS W/TWL LRG LVL3 (GOWN DISPOSABLE) ×4
GOWN STRL REUS W/TWL XL LVL3 (GOWN DISPOSABLE) ×8
HEMOSTAT POWDER KIT SURGIFOAM (HEMOSTASIS) IMPLANT
HEMOSTAT SURGICEL 2X14 (HEMOSTASIS) ×3 IMPLANT
HOOK DURA (MISCELLANEOUS) ×3 IMPLANT
HOOK DURA 1/2IN (MISCELLANEOUS) ×3 IMPLANT
KIT BASIN OR (CUSTOM PROCEDURE TRAY) ×3 IMPLANT
KIT DRAIN CSF ACCUDRAIN (MISCELLANEOUS) IMPLANT
KIT ROOM TURNOVER OR (KITS) ×3 IMPLANT
KNIFE ARACHNOID DISP AM-24-S (MISCELLANEOUS) ×3 IMPLANT
KNIFE ARACHNOID DISP AM-24-SB (BLADE) ×3 IMPLANT
NEEDLE HYPO 25GX1X1/2 BEV (NEEDLE) IMPLANT
NEEDLE HYPO 25X1 1.5 SAFETY (NEEDLE) ×3 IMPLANT
NEEDLE SPNL 25GX3.5 QUINCKE BL (NEEDLE) IMPLANT
NS IRRIG 1000ML POUR BTL (IV SOLUTION) ×3 IMPLANT
OIL CARTRIDGE MAESTRO DRILL (MISCELLANEOUS) ×3
PACK CRANIOTOMY (CUSTOM PROCEDURE TRAY) ×3 IMPLANT
PAD ARMBOARD 7.5X6 YLW CONV (MISCELLANEOUS) ×3 IMPLANT
PATTIES SURGICAL .25X.25 (GAUZE/BANDAGES/DRESSINGS) IMPLANT
PATTIES SURGICAL .5 X.5 (GAUZE/BANDAGES/DRESSINGS) ×3 IMPLANT
PATTIES SURGICAL .5 X3 (DISPOSABLE) IMPLANT
PATTIES SURGICAL 1/4 X 3 (GAUZE/BANDAGES/DRESSINGS) IMPLANT
PATTIES SURGICAL 1X1 (DISPOSABLE) ×3 IMPLANT
PERFORATOR LRG  14-11MM (BIT)
PERFORATOR LRG 14-11MM (BIT) IMPLANT
PIN MAYFIELD SKULL DISP (PIN) IMPLANT
PLATE 1.5 4HOLE LONG STRAIGHT (Plate) ×3 IMPLANT
PLATE 1.5 6HOLE EXT ST L (Plate) ×3 IMPLANT
PLATE 1.5 6HOLE XLONG DBL Y (Plate) ×6 IMPLANT
RUBBERBAND STERILE (MISCELLANEOUS) ×6 IMPLANT
SCREW SELF DRILL HT 1.5/4MM (Screw) ×27 IMPLANT
SPONGE NEURO XRAY DETECT 1X3 (DISPOSABLE) IMPLANT
SPONGE SURGIFOAM ABS GEL 100 (HEMOSTASIS) ×3 IMPLANT
STAPLER VISISTAT 35W (STAPLE) ×6 IMPLANT
STOCKINETTE 6  STRL (DRAPES) ×2
STOCKINETTE 6 STRL (DRAPES) ×1 IMPLANT
SUT ETHILON 3 0 FSL (SUTURE) IMPLANT
SUT NURALON 4 0 TR CR/8 (SUTURE) ×6 IMPLANT
SUT VIC AB 0 CT1 18XCR BRD8 (SUTURE) ×2 IMPLANT
SUT VIC AB 0 CT1 8-18 (SUTURE) ×4
SUT VIC AB 3-0 SH 8-18 (SUTURE) ×3 IMPLANT
SYR BULB 3OZ (MISCELLANEOUS) ×3 IMPLANT
TAPE CLOTH 1X10 TAN NS (GAUZE/BANDAGES/DRESSINGS) ×3 IMPLANT
TIP NONSTICK .5MMX23CM (INSTRUMENTS)
TIP NONSTICK .5X23 (INSTRUMENTS) IMPLANT
TOWEL GREEN STERILE (TOWEL DISPOSABLE) ×2 IMPLANT
TOWEL GREEN STERILE FF (TOWEL DISPOSABLE) ×3 IMPLANT
TRAY FOLEY W/METER SILVER 16FR (SET/KITS/TRAYS/PACK) IMPLANT
TUBE CONNECTING 12'X1/4 (SUCTIONS) ×1
TUBE CONNECTING 12X1/4 (SUCTIONS) ×2 IMPLANT
UNDERPAD 30X30 (UNDERPADS AND DIAPERS) IMPLANT
WATER STERILE IRR 1000ML POUR (IV SOLUTION) ×3 IMPLANT

## 2017-01-07 NOTE — Transfer of Care (Signed)
Immediate Anesthesia Transfer of Care Note  Patient: Bonnie Tran  Procedure(s) Performed: Procedure(s): CRANIOTOMY INTRACRANIAL ANEURYSM FOR CAROTID (Left)  Patient Location: NICU  Anesthesia Type:General  Level of Consciousness: sedated and Patient remains intubated per anesthesia plan  Airway & Oxygen Therapy: Patient remains intubated per anesthesia plan and Patient placed on Ventilator (see vital sign flow sheet for setting)  Post-op Assessment: Report given to RN and Post -op Vital signs reviewed and stable  Post vital signs: Reviewed and stable  Last Vitals:  Vitals:   01/07/17 2225 01/07/17 2230  BP: 102/64 99/61  Pulse: 67 67  Resp: 11 12  Temp:      Last Pain:  Vitals:   01/07/17 1636  TempSrc:   PainSc: 7          Complications: No apparent anesthesia complications

## 2017-01-07 NOTE — Sedation Documentation (Signed)
5 Fr. Exoseal to right groin 

## 2017-01-07 NOTE — Transfer of Care (Signed)
Immediate Anesthesia Transfer of Care Note  Patient: Bonnie Tran  Procedure(s) Performed: Procedure(s): RADIOLOGY WITH ANESTHESIA (N/A)  Patient Location: TRansferred to OR 21  Anesthesia Type:General  Level of Consciousness: Patient remains intubated per anesthesia plan  Airway & Oxygen Therapy: Patient remains intubated per anesthesia plan and Patient placed on Ventilator (see vital sign flow sheet for setting)  Post-op Assessment: Post -op Vital signs reviewed and stable  Post vital signs: Reviewed  Last Vitals:  Vitals:   01/07/17 1500 01/07/17 1600  BP: 100/63 (!) 97/59  Pulse: 76 71  Resp: 18 13  Temp:      Last Pain:  Vitals:   01/07/17 1636  TempSrc:   PainSc: 7          Complications: No apparent anesthesia complications

## 2017-01-07 NOTE — H&P (Signed)
Bonnie Tran is an 41 y.o. female.   Chief Complaint: Headache after lifting weights HPI: Bonnie Tran is a 41 y.o. female who presented to the Emergency Department complaining of sudden onset, severe, throbbing HA that began at 5:45 PM 01/06/17 with associated nausea. Pt stated her pain begins at her posterior neck and radiates through the occiput, parietal, frontal and temporal regions of her head. Per pt, she was doing shoulder presses at the gym, which she reports is a normal exercise for her however she hadn't done it in a month, when she suddenly began to experience an intense throbbing pain in her posterior neck/head. Pt states she had not done this exercise in over a month, and was straining with her normal 20 lb weights that she was doing before. She stated her HA is worsened with retching, head movement, speaking, light and sound. Pt reports mild relief of pain with ice application to her posterior neck, pressure applied to her forehead, and no relief with 2 Clonopin. No h/o similar headaches or frequent headaches; no FHx of headaches. Pt is right hand dominant. She denies vomiting, blurred vision, numbness/tingling to the extremities.   Workup in ER included head CT which shows diffuse subarachnoid hemorrhage.  CTA shows left carotid terminus aneurysm measuring 4 x 3 mm.    Patient was transferred from [facility] ER to [facility] NICU for further care.  No past medical history on file.  No past surgical history on file.  No family history on file. Social History:  has no tobacco, alcohol, and drug history on file.  Allergies: No Known Allergies  Medications Prior to Admission  Medication Sig Dispense Refill  . amphetamine-dextroamphetamine (ADDERALL) 10 MG tablet Take 10 mg by mouth 2 (two) times daily with a meal.    . citalopram (CELEXA) 10 MG tablet Take 10 mg by mouth daily.    . clonazePAM (KLONOPIN) 1 MG tablet Take 0.5-1 mg by mouth at bedtime as needed for anxiety.      Results for orders  placed or performed during the hospital encounter of 01/06/17 (from the past 48 hour(s))  CBC with Differential     Status: Abnormal   Collection Time: 01/07/17  1:16 AM  Result Value Ref Range   WBC 9.6 4.0 - 10.5 K/uL   RBC 3.53 (L) 3.87 - 5.11 MIL/uL   Hemoglobin 10.5 (L) 12.0 - 15.0 g/dL   HCT 30.9 (L) 36.0 - 46.0 %   MCV 87.5 78.0 - 100.0 fL   MCH 29.7 26.0 - 34.0 pg   MCHC 34.0 30.0 - 36.0 g/dL   RDW 12.8 11.5 - 15.5 %   Platelets 160 150 - 400 K/uL   Neutrophils Relative % 89 %   Neutro Abs 8.6 (H) 1.7 - 7.7 K/uL   Lymphocytes Relative 7 %   Lymphs Abs 0.7 0.7 - 4.0 K/uL   Monocytes Relative 4 %   Monocytes Absolute 0.3 0.1 - 1.0 K/uL   Eosinophils Relative 0 %   Eosinophils Absolute 0.0 0.0 - 0.7 K/uL   Basophils Relative 0 %   Basophils Absolute 0.0 0.0 - 0.1 K/uL  Comprehensive metabolic panel     Status: Abnormal   Collection Time: 01/07/17  1:16 AM  Result Value Ref Range   Sodium 134 (L) 135 - 145 mmol/L   Potassium 3.9 3.5 - 5.1 mmol/L   Chloride 105 101 - 111 mmol/L   CO2 24 22 - 32 mmol/L   Glucose, Bld 102 (H) 65 -  99 mg/dL   BUN 9 6 - 20 mg/dL   Creatinine, Ser 0.66 0.44 - 1.00 mg/dL   Calcium 8.6 (L) 8.9 - 10.3 mg/dL   Total Protein 6.5 6.5 - 8.1 g/dL   Albumin 3.6 3.5 - 5.0 g/dL   AST 80 (H) 15 - 41 U/L   ALT 59 (H) 14 - 54 U/L   Alkaline Phosphatase 28 (L) 38 - 126 U/L   Total Bilirubin 1.0 0.3 - 1.2 mg/dL   GFR calc non Af Amer >60 >60 mL/min   GFR calc Af Amer >60 >60 mL/min    Comment: (NOTE) The eGFR has been calculated using the CKD EPI equation. This calculation has not been validated in all clinical situations. eGFR's persistently <60 mL/min signify possible Chronic Kidney Disease.    Anion gap 5 5 - 15   Ct Angio Head W Or Wo Contrast  Addendum Date: 01/07/2017   ADDENDUM REPORT: 01/07/2017 01:44 ADDENDUM: The aneurysm described above is actually distal and lateral to the origin of the left ophthalmic artery, rather than at its origin  as initially stated. It projects laterally from the carotid terminus, just inferior to the origin of the middle cerebral artery. Electronically Signed   By: Ulyses Jarred M.D.   On: 01/07/2017 01:44   Result Date: 01/07/2017 CLINICAL DATA:  Subarachnoid hemorrhage EXAM: CT ANGIOGRAPHY HEAD TECHNIQUE: Multidetector CT imaging of the head was performed using the standard protocol during bolus administration of intravenous contrast. Multiplanar CT image reconstructions and MIPs were obtained to evaluate the vascular anatomy. 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 This is at the origin of the ophthalmic artery. The right ICA is normal. --Anterior cerebral arteries: There is a persistent median callosal artery, a normal variant. Otherwise normal. --Middle cerebral arteries: Normal. --Posterior communicating arteries: Present bilaterally. Posterior circulation: --Posterior cerebral arteries: Normal. --Superior cerebellar arteries: Normal. --Basilar artery: Normal. --Anterior inferior cerebellar arteries: Normal. --Posterior inferior cerebellar arteries: Normal. Venous sinuses: As permitted by contrast timing, patent. Anatomic variants: None Delayed phase: Vascular enhancement obscures assessment of subtle subarachnoid blood in the sylvian fissures seen on the previous study. Review of the MIP images confirms the above findings IMPRESSION: 1. 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 2. Otherwise normal intracranial arteries. Electronically Signed: By: Ulyses Jarred M.D. On: 01/07/2017 01:00   Ct Head Wo Contrast  Result Date: 01/07/2017 CLINICAL DATA:  Acute onset of headache.  Initial encounter. EXAM: CT HEAD WITHOUT CONTRAST TECHNIQUE: Contiguous  axial images were obtained from the base of the skull through the vertex without intravenous contrast. COMPARISON:  None. FINDINGS: Brain: Acute subarachnoid hemorrhage is seen tracking along the sylvian fissures bilaterally, and is suggested minimally about the circle of Willis. This raises suspicion for a ruptured aneurysm, possibly at the anterior communicating artery. The posterior fossa, including the cerebellum, brainstem and fourth ventricle, is within normal limits. The third and lateral ventricles, and basal ganglia are unremarkable in appearance. The cerebral hemispheres are symmetric in appearance, with normal gray-white differentiation. No midline shift is seen. Vascular: No hyperdense vessel or unexpected calcification. Skull: There is no evidence of fracture; visualized osseous structures are unremarkable in appearance. Sinuses/Orbits: The orbits are within normal limits. The paranasal sinuses and mastoid air cells  are well-aerated. Other: No significant soft tissue abnormalities are seen. IMPRESSION: Acute subarachnoid hemorrhage tracking along the sylvian fissures bilaterally, and minimally about the circle of Willis. This raises suspicion for ruptured aneurysm, possibly at the anterior communicating artery. CTA of the head is recommended for further evaluation. Critical Value/emergent results were called by telephone at the time of interpretation on 01/07/2017 at 12:05 am to Dr. Rolland Porter, who verbally acknowledged these results. Electronically Signed   By: Garald Balding M.D.   On: 01/07/2017 00:09    Review of Systems  Constitutional: Negative.   HENT: Negative.   Eyes: Negative.   Respiratory: Negative.   Cardiovascular: Negative.   Gastrointestinal: Negative.   Genitourinary: Negative.   Musculoskeletal: Negative.   Skin: Negative.   Endo/Heme/Allergies: Negative.   Psychiatric/Behavioral: Negative.     Blood pressure 105/65, pulse 73, temperature 97.5 F (36.4 C), temperature  source Oral, resp. rate 19, SpO2 99 %. Physical Exam  Constitutional: She is oriented to person, place, and time. She appears well-developed and well-nourished.  HENT:  Head: Normocephalic.  Eyes: Conjunctivae and EOM are normal. Pupils are equal, round, and reactive to light.  Neck: Neck rigidity present.  Cardiovascular: Normal rate and regular rhythm.   Respiratory: Effort normal and breath sounds normal.  GI: Soft. Bowel sounds are normal.  Musculoskeletal: Normal range of motion.  Neurological: She is alert and oriented to person, place, and time. She has normal strength and normal reflexes. No cranial nerve deficit or sensory deficit. GCS eye subscore is 4. GCS verbal subscore is 5. GCS motor subscore is 6.  Sleepy but arouses easily.  Photophobia.  Meningismus.     Assessment/Plan Patient is 41 year old female with sudden onset of severe headache, nausea, meningismus yesterday evening after lifting weights.  She presented to [facility] ER where head CT showed SAH and CTA showed left ICA terminus aneurysm 4 x 3 mm.  Patient H/H 2, transferred to [facility] for definitive care.  I will review patient's case with Dr. Kathyrn Sheriff.  She will likely require cerebral angiogram with coiling of aneurysm.  Peggyann Shoals, MD 01/07/2017, 6:34 AM

## 2017-01-07 NOTE — Progress Notes (Signed)
Pts history reviewed. Sudden onset HA yesterday afternoon while lifting weights. Associated neck pain. No blurry vision, N/T/W. No other PMH, no FH aneurysms, non-smoker.  EXAM:  BP (!) 102/52 (BP Location: Left Arm)   Pulse 78   Temp 99.2 F (37.3 C) (Oral)   Resp 11   SpO2 98%   Drowsy, easily arousable Speech fluent, appropriate  CN grossly intact  5/5 BUE/BLE   IMPRESSION:  41 y.o. 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 I explained to them that intracranial aneurysm was the most common non[facility]traumatic [facility] and that the definitive diagnosis is made by diagnostic angiogram. I also explained to them the possible treatment options for intracranial aneurysms including endovascular coiling and open clip ligation. The risks of the angiogram, coiling, and surgical clipping were also reviewed to include stroke and aneurysm re-rupture leading to weakness/paralysis/coma/death, infection, SZ, hydrocephalus. I also talked to them about the possibility of ventriculostomy for CSF drainage and the risks of this procedure.  The pts uncle understood our discussion and he provided consent to proceed with diagnostic angiogram and the appropriate treatment for any identified aneurysm.

## 2017-01-07 NOTE — Sedation Documentation (Signed)
Pt transferred to OR with anesthesia. Bedside report given.

## 2017-01-07 NOTE — Op Note (Signed)
PREOP DIAGNOSIS:  1. Subarachnoid Hemorrhage 2. Left posterior communicating artery aneurysm  POSTOP DIAGNOSIS: Same  PROCEDURE: 1. Left pterional craniotomy, clipping of posterior communicating artery aneurysm 2. Use of operating microscope for microdissection 3. Use of intraoperative ICG videoangiography   SURGEON: Dr. Lisbeth Renshaw, MD  ASSISTANT: Dr. Coletta Memos, MD  ANESTHESIA: General Endotracheal  EBL: 500cc  SPECIMENS: None  DRAINS: None  COMPLICATIONS: None immediate  CONDITION: Stable to ICU  HISTORY: Bonnie Tran is a 41 y.o. female presenting to the hospital with headache and found to have subarachnoid hemorrhage. Angiogram demonstrated an irregular left Pcom aneurysm felt better treated by craniotomy for clipping. Risks and benefits of surgery were reviewed in detail with the patient's family prior to surgery. All questions were answered.  PROCEDURE IN DETAIL: After informed consent was obtained and witnessed, the patient was brought to the operating room. After induction of general anesthesia, the patient was positioned on the operative table in the supine position in the Mayfield headholder. All pressure points were meticulously padded. Skin incision was then marked out and prepped and draped in the usual sterile fashion.  After time-out was conducted, curvilinear skin incision from the zygoma to the midline was made sharply and Bovie electrocautery was used to dissect the subcutaneous tissue and galea. Raney clips were then used to secure hemostasis on the skin edges. The superficial temporal artery was dissected free and retracted with the skin flap. Bovie electrocautery was used to dissect through the pericranium. Skin was retracted anteriorly after the temporalis fascia was elevated in the interfacial plane. Temporalis muscle was then incised and reflected inferiorly. Fishhooks were then used for retraction. Bur holes were then created in the pterion, above the  root of the zygoma, and the superior temporal line. These are then connected with the craniotome and a standard pterional craniotomy flap was elevated. Hemostasis was achieved on the bone edges, and a high-speed drill was used to drill down the lesser wing of the sphenoid.  The dura was then opened in cruciate fashion and good hemostasis was achieved on the dural edges. At this point the microscope was draped and brought into the field and the remainder of the case was done under the microscope using microdissection.  The optic nerve and the optical carotid cistern were then incised and dissection was carried laterally until the internal carotid artery was identified. The aneurysm was immediately identified just lateral to the carotid artery. The proximal portion of the sylvian fissure was therefore split to allow access to the distal supraclinoid internal carotid artery without traction on the aneurysm. The remainder of the supraclinoid internal carotid artery was then dissected free. The proximal and distal portion of the aneurysm neck was identified, and a curved standard titanium Aesculap aneurysm clip was placed across the neck of the aneurysm. Further dissection was carried out to dissect the aneurysm dome away from the sylvian veins, as well as the tentorium, and partially away from the oculomotor nerve. During this dissection, the dome of the aneurysm was entered, and there was no arterial bleeding indicat complete clipping of the aneurysm.   Once the hemostasis was secured using a combination of bipolar electrocautery and passive hemostats, the anesthesia service was instructed to administer a 12.5 mg bolus of ICG. Under the infrared camera, we confirmed the patency of the internal carotid artery. No filling of the aneurysm dome was identified.  At this point the wound is irrigated with copious amounts of normal saline irrigation. Good hemostasis was confirmed  on the brain surface. The dura was then  closed using a combination of interrupted and continuous 4-0 Nurolon stitches. A small piece of DuraMatrix was then placed over the dural surface suture line. Muscle was then closed using interrupted 0 Vicryl stitches, and the galea was closed using interrupted 3-0 Vicryl sutures. The skin was closed using standard surgical skin staples. Sterile dressing was then applied after the Mayfield head holder was removed. The patient was then transferred to the stretcher and taken to the ICU in stable hemodynamic condition.  At the end of the case all sponge, needle, and instrument counts were correct.

## 2017-01-07 NOTE — Anesthesia Procedure Notes (Signed)
Procedure Name: Intubation Date/Time: 01/07/2017 5:45 PM Performed by: Barrington Ellison Pre-anesthesia Checklist: Patient identified, Emergency Drugs available, Suction available and Patient being monitored Patient Re-evaluated:Patient Re-evaluated prior to inductionOxygen Delivery Method: Circle System Utilized Preoxygenation: Pre-oxygenation with 100% oxygen Intubation Type: IV induction Ventilation: Mask ventilation without difficulty Laryngoscope Size: Mac and 3 Grade View: Grade II Tube type: Subglottic suction tube Tube size: 7.5 mm Number of attempts: 1 Airway Equipment and Method: Stylet Placement Confirmation: ETT inserted through vocal cords under direct vision,  positive ETCO2 and breath sounds checked- equal and bilateral Secured at: 22 cm Tube secured with: Tape Dental Injury: Teeth and Oropharynx as per pre-operative assessment

## 2017-01-07 NOTE — Progress Notes (Signed)
PT Cancellation Note  Patient Details Name: Bonnie Tran MRN: [id number] DOB: 1976-02-10   Cancelled Treatment:    Reason Eval/Treat Not Completed: Patient not medically ready.  Pt on bedrest and noted plan for angiogram and possible crani vs coiling.  Will hold PT eval at this time and need updated activity orders once appropriate for PT and mobility.     Alison MurrayMegan F Mabeline Varas, South CarolinaPT [phone] 01/07/2017, 11:22 AM

## 2017-01-07 NOTE — Anesthesia Preprocedure Evaluation (Signed)
Anesthesia Evaluation  Patient identified by MRN, date of birth, ID band Patient awake    Reviewed: Allergy & Precautions, NPO status , Patient's Chart, lab work & pertinent test results  Airway Mallampati: II  TM Distance: >3 FB Neck ROM: Full    Dental  (+) Dental Advisory Given   Pulmonary neg pulmonary ROS,    breath sounds clear to auscultation       Cardiovascular negative cardio ROS   Rhythm:Regular Rate:Normal     Neuro/Psych SAH likely Left PCOM aneurysm    GI/Hepatic negative GI ROS, Neg liver ROS,   Endo/Other  negative endocrine ROS  Renal/GU negative Renal ROS     Musculoskeletal   Abdominal   Peds  Hematology  (+) anemia ,   Anesthesia Other Findings   Reproductive/Obstetrics                             Lab Results  Component Value Date   WBC 9.6 01/07/2017   HGB 10.5 (L) 01/07/2017   HCT 30.9 (L) 01/07/2017   MCV 87.5 01/07/2017   PLT 160 01/07/2017   Lab Results  Component Value Date   CREATININE 0.66 01/07/2017   BUN 9 01/07/2017   NA 134 (L) 01/07/2017   K 3.9 01/07/2017   CL 105 01/07/2017   CO2 24 01/07/2017    Anesthesia Physical Anesthesia Plan  ASA: IV  Anesthesia Plan: General   Post-op Pain Management:    Induction: Intravenous  Airway Management Planned: Oral ETT  Additional Equipment: Arterial line, CVP and Ultrasound Guidance Line Placement  Intra-op Plan:   Post-operative Plan: Possible Post-op intubation/ventilation  Informed Consent: I have reviewed the patients History and Physical, chart, labs and discussed the procedure including the risks, benefits and alternatives for the proposed anesthesia with the patient or authorized representative who has indicated his/her understanding and acceptance.   Dental advisory given  Plan Discussed with:   Anesthesia Plan Comments:         Anesthesia Quick Evaluation

## 2017-01-07 NOTE — Consult Note (Signed)
PULMONARY / CRITICAL CARE MEDICINE   Name: Bonnie Tran MRN: [id number] DOB: 02-Jun-1976    ADMISSION DATE:  01/06/2017 CONSULTATION DATE:  @TODAY @   REFERRING MD  CHIEF COMPLAINT:  HISTORY OF PRESENT ILLNESS:   Bonnie Tran is a 41 year old female who presents for [facility] s/p left posterior communicating artery aneurysm.  Patient is intubated sedated. No family present at bedside. Much of history is obtained per prior notes.   Patient developed sudden onset headache, presented to ED and found to have intracranial abnormality. She was taken to OR for clipping. It remains unclear to my why she is intubated in ICU. She does seem to awaken on sedation holiday but is not responsive to commands. Patient is normotensive, bilateral breath sounds, SCDs on bilateral legs.    PAST MEDICAL HISTORY :  She  has no past medical history on file.  PAST SURGICAL HISTORY: She  has no past surgical history on file.  No Known Allergies  No current facility-administered medications on file prior to encounter.    No current outpatient prescriptions on file prior to encounter.    FAMILY HISTORY:  Her has no family status information on file.    SOCIAL HISTORY: She    REVIEW OF SYSTEMS:   Unable to obtain. Intubated, sedated  VITAL SIGNS: BP (!) 146/91   Pulse 65   Temp 97.7 F (36.5 C) (Axillary)   Resp 15   Ht 5\' 4"  (1.626 m)   Wt 134 lb 7.7 oz (61 kg)   LMP 01/06/2017 (Exact Date)   SpO2 100%   BMI 23.08 kg/m   HEMODYNAMICS:    VENTILATOR SETTINGS: Vent Mode: PRVC FiO2 (%):  [50 %] 50 % Set Rate:  [12 bmp] 12 bmp Vt Set:  [500 mL] 500 mL PEEP:  [5 cmH20] 5 cmH20 Plateau Pressure:  [15 cmH20] 15 cmH20  INTAKE / OUTPUT: I/O last 3 completed shifts: In: 1501.3 [I.V.:1396.3; IV Piggyback:105] Out: 860 [Urine:850; Blood:10]  PHYSICAL EXAMINATION: Physical Exam: Temp:  [97.7 F (36.5 C)-99.2 F (37.3 C)] 97.7 F (36.5 C) (03/23 2243) Pulse Rate:  [65-91] 65 (03/23 2245) Resp:   [11-22] 15 (03/23 2315) BP: (91-156)/(49-99) 146/91 (03/23 2315) SpO2:  [95 %-100 %] 100 % (03/23 2245) Arterial Line BP: (103-154)/(57-105) 142/84 (03/23 2315) FiO2 (%):  [50 %] 50 % (03/23 2230) Weight:  [134 lb 7.7 oz (61 kg)] 134 lb 7.7 oz (61 kg) (03/23 2245)  General no apparent distress  HEENT No gross abnormalities.  Pulmonary Clear to auscultation bilaterally with no wheezes, rales or ronchi. Good effort, symmetrical expansion.   Cardiovascular Normal rate, regular rhythm. S1, s2. No m/r/g. Distal pulses palpable.  Abdomen Soft, non-tender, non-distended, positive bowel sounds, no palpable organomegaly or masses. Normoresonant to percussion.  Musculoskeletal Moves upper extremities without purpose  Lymphatics No cervical, supraclavicular or axillary adenopathy.   Neurologic intubaed, sedated.. Not responsive to commands at present  Skin/Integuement No rash, no cyanosis, no clubbing.     LABS:  BMET  Recent Labs Lab 01/07/17 0116  NA 134*  K 3.9  CL 105  CO2 24  BUN 9  CREATININE 0.66  GLUCOSE 102*    Electrolytes  Recent Labs Lab 01/07/17 0116  CALCIUM 8.6*    CBC  Recent Labs Lab 01/07/17 0116  WBC 9.6  HGB 10.5*  HCT 30.9*  PLT 160    Coag's No results for input(s): APTT, INR in the last 168 hours.  Sepsis Markers No results for input(s): LATICACIDVEN,  PROCALCITON, O2SATVEN in the last 168 hours.  ABG No results for input(s): PHART, PCO2ART, PO2ART in the last 168 hours.  Liver Enzymes  Recent Labs Lab 01/07/17 0116  AST 80*  ALT 59*  ALKPHOS 28*  BILITOT 1.0  ALBUMIN 3.6    Cardiac Enzymes No results for input(s): TROPONINI, PROBNP in the last 168 hours.  Glucose No results for input(s): GLUCAP in the last 168 hours.  Imaging Ct Angio Head W Or Wo Contrast  Addendum Date: 01/07/2017   ADDENDUM REPORT: 01/07/2017 01:44 ADDENDUM: The aneurysm described above is actually distal and lateral to the origin of the left ophthalmic  artery, rather than at its origin as initially stated. It projects laterally from the carotid terminus, just inferior to the origin of the middle cerebral artery. Electronically Signed   By: Deatra Robinson M.D.   On: 01/07/2017 01:44   Result Date: 01/07/2017 CLINICAL DATA:  Subarachnoid hemorrhage EXAM: CT ANGIOGRAPHY HEAD TECHNIQUE: Multidetector CT imaging of the head was performed using the standard protocol during bolus administration of intravenous contrast. Multiplanar CT image reconstructions and MIPs were obtained to evaluate the vascular anatomy. 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 This is at the origin of the ophthalmic artery. The right ICA is normal. --Anterior cerebral arteries: There is a persistent median callosal artery, a normal variant. Otherwise normal. --Middle cerebral arteries: Normal. --Posterior communicating arteries: Present bilaterally. Posterior circulation: --Posterior cerebral arteries: Normal. --Superior cerebellar arteries: Normal. --Basilar artery: Normal. --Anterior inferior cerebellar arteries: Normal. --Posterior inferior cerebellar arteries: Normal. Venous sinuses: As permitted by contrast timing, patent. Anatomic variants: None Delayed phase: Vascular enhancement obscures assessment of subtle subarachnoid blood in the sylvian fissures seen on the previous study. Review of the MIP images confirms the above findings IMPRESSION: 1. 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 2. Otherwise normal intracranial arteries. Electronically Signed: By: Deatra Robinson M.D. On: 01/07/2017 01:00   Ct Head Wo Contrast  Result Date: 01/07/2017 CLINICAL DATA:  Acute onset of headache.  Initial encounter. EXAM: CT HEAD WITHOUT  CONTRAST TECHNIQUE: Contiguous axial images were obtained from the base of the skull through the vertex without intravenous contrast. COMPARISON:  None. FINDINGS: Brain: Acute subarachnoid hemorrhage is seen tracking along the sylvian fissures bilaterally, and is suggested minimally about the circle of Willis. This raises suspicion for a ruptured aneurysm, possibly at the anterior communicating artery. The posterior fossa, including the cerebellum, brainstem and fourth ventricle, is within normal limits. The third and lateral ventricles, and basal ganglia are unremarkable in appearance. The cerebral hemispheres are symmetric in appearance, with normal gray-white differentiation. No midline shift is seen. Vascular: No hyperdense vessel or unexpected calcification. Skull: There is no evidence of fracture; visualized osseous structures are unremarkable in appearance. Sinuses/Orbits: The orbits are within normal limits. The paranasal sinuses and mastoid air cells are well-aerated. Other: No significant soft tissue abnormalities are seen. IMPRESSION: Acute subarachnoid hemorrhage tracking along the sylvian fissures bilaterally, and minimally about the circle of Willis. This raises suspicion for ruptured aneurysm, possibly at the anterior communicating artery. CTA of the head is recommended for further evaluation. Critical Value/emergent results were called by telephone at the time of interpretation on 01/07/2017 at 12:05 am to Dr. Devoria Albe, who verbally  acknowledged these results. Electronically Signed   By: Roanna RaiderJeffery  Chang M.D.   On: 01/07/2017 00:09   Portable Chest Xray  Result Date: 01/07/2017 CLINICAL DATA:  Endotracheal tube and orogastric tube placement. Initial encounter. EXAM: PORTABLE CHEST 1 VIEW COMPARISON:  None. FINDINGS: The patient's endotracheal tube is seen ending 2-3 cm above the carina. An enteric tube is noted extending below the diaphragm. The lungs are well-aerated and clear. There is no  evidence of focal opacification, pleural effusion or pneumothorax. The cardiomediastinal silhouette is within normal limits. No acute osseous abnormalities are seen. IMPRESSION: 1. Endotracheal tube seen ending 2-3 cm above the carina. 2. Enteric tube noted extending below the diaphragm. 3. No acute cardiopulmonary process seen. Electronically Signed   By: Roanna RaiderJeffery  Chang M.D.   On: 01/07/2017 23:07   CULTURES:  ANTIBIOTICS: none  SIGNIFICANT EVENTS: s/p clipping  LINES/TUBES: ETT, Foley, PIV, Aline  DISCUSSION: 41 year old female with acute IPH 2/2 aneurysmal bleed. CCM consulted for ventilator management.  ASSESSMENT / PLAN:  PULMONARY A: Intubated for OR case. Patient remains intubated. P: continue mechanical ventilation with low tidal volume 6-917ml/kg IBW  Continue propofol SBT in am an anticipate extubation   CARDIOVASCULAR A: not active P; continue tele monitoring Continue A line  RENAL A:  Not active   GASTROINTESTINAL A:  Not active   HEMATOLOGIC A:  Not active  INFECTIOUS A:  Not active  ENDOCRINE A:  Not active  NEUROLOGIC A:  IPH with PCom aneurysm s/p clipping P:   Management per neurosurgery  FEN Avoid chemical DVT ppx, SCDs on bilaterally Add stress ulcer ppx Hold enteral nutrition, restart tomorrow, vs extubate Lytes stable  FAMILY  - Updates: family not present at bedside.  - Inter-disciplinary family meet or Palliative Care meeting due by:  day 7   The patient is critically ill with multiple organ system failure and requires high complexity decision making for assessment and support, frequent evaluation and titration of therapies, advanced monitoring, review of radiographic studies and interpretation of complex data.   Critical Care Time devoted to patient care services, exclusive of separately billable procedures, described in this note is  30 minutes.   Greta DoomAlexis C Cate Oravec DO [facility] Pager: [phone]  01/07/2017, 11:23 PM

## 2017-01-07 NOTE — Anesthesia Postprocedure Evaluation (Signed)
Anesthesia Post Note  Patient: Bonnie Tran  Procedure(s) Performed: Procedure(s) (LRB): CRANIOTOMY INTRACRANIAL ANEURYSM FOR CAROTID (Left)  Patient location during evaluation: ICU Level of consciousness: patient remains intubated per anesthesia plan Pain management: pain level controlled Vital Signs Assessment: post-procedure vital signs reviewed and stable Respiratory status: patient remains intubated per anesthesia plan Cardiovascular status: stable Anesthetic complications: no       Last Vitals:  Vitals:   01/07/17 1500 01/07/17 1600  BP: 100/63 (!) 97/59  Pulse: 76 71  Resp: 18 13  Temp:      Last Pain:  Vitals:   01/07/17 1636  TempSrc:   PainSc: 7                  Kaison Mcparland

## 2017-01-07 NOTE — Anesthesia Preprocedure Evaluation (Signed)
Anesthesia Evaluation  Patient identified by MRN, date of birth, ID band Patient unresponsive  Preop documentation limited or incomplete due to emergent nature of procedure.  Airway Mallampati: Intubated       Dental   Pulmonary    breath sounds clear to auscultation       Cardiovascular  Rhythm:Regular Rate:Normal     Neuro/Psych    GI/Hepatic   Endo/Other    Renal/GU      Musculoskeletal   Abdominal   Peds  Hematology   Anesthesia Other Findings   Reproductive/Obstetrics                             Anesthesia Physical Anesthesia Plan  ASA: IV and emergent  Anesthesia Plan: General   Post-op Pain Management:    Induction: Intravenous  Airway Management Planned: Oral ETT  Additional Equipment:   Intra-op Plan:   Post-operative Plan: Post-operative intubation/ventilation  Informed Consent:   Plan Discussed with: Anesthesiologist, CRNA and Surgeon  Anesthesia Plan Comments:         Anesthesia Quick Evaluation

## 2017-01-07 NOTE — Progress Notes (Signed)
SLP Cancellation Note  Patient Details Name: Bonnie Tran MRN: [id number] DOB: 1976/05/02   Cancelled treatment:       Reason Eval/Treat Not Completed: Patient not medically ready. Note plan for angiogram and possible crani vs coiling. RN stroke swallow screen not completed yet. Will f/u post-procedure.    [name], Elonda Giuliano 01/07/2017, 2:48 PM  Maxcine HamLaura Paiewonsky, M.A. CCC-SLP [phone]

## 2017-01-07 NOTE — Progress Notes (Signed)
eLink Physician-Brief Progress Note Patient Name: Bonnie FantasiaDawn Custard DOB: 01-24-1976 MRN: [id number]   Date of Service  01/07/2017  HPI/Events of Note  Patient newly admitted to neuro intensive care unit. No H&P available yet. Review of emergency department electronic medical record shows 41 year old female residing with sudden onset of severe, throbbing headache with associated nausea. Patient was found to have subarachnoid hemorrhage and aneurysm on CT angiogram. Dr. Venetia MaxonStern with neurosurgery was consulted. Camera check shows patient resting comfortably. Currently normotensive.   eICU Interventions  Further plan of care per the admitting neurosurgeon.      Intervention Category Evaluation Type: New Patient Evaluation  Lawanda CousinsJennings Nestor 01/07/2017, 4:42 AM

## 2017-01-08 DIAGNOSIS — J9600 Acute respiratory failure, unspecified whether with hypoxia or hypercapnia: Secondary | ICD-10-CM

## 2017-01-08 DIAGNOSIS — I609 Nontraumatic subarachnoid hemorrhage, unspecified: Secondary | ICD-10-CM

## 2017-01-08 LAB — BLOOD GAS, ARTERIAL
Acid-base deficit: 5.2 mmol/L — ABNORMAL HIGH (ref 0.0–2.0)
Bicarbonate: 19.6 mmol/L — ABNORMAL LOW (ref 20.0–28.0)
DRAWN BY: 249101
FIO2: 50
MECHVT: 400 mL
O2 Saturation: 99.2 %
PATIENT TEMPERATURE: 98.6
PEEP/CPAP: 5 cmH2O
PO2 ART: 237 mmHg — AB (ref 83.0–108.0)
RATE: 12 resp/min
pCO2 arterial: 37.6 mmHg (ref 32.0–48.0)
pH, Arterial: 7.337 — ABNORMAL LOW (ref 7.350–7.450)

## 2017-01-08 LAB — TRIGLYCERIDES: TRIGLYCERIDES: 45 mg/dL (ref <150)

## 2017-01-08 NOTE — Procedures (Signed)
Extubation Procedure Note  Patient Details:   Name: Bonnie Tran DOB: 27-Mar-1976 MRN: [id number]   Airway Documentation:     Evaluation  O2 sats: stable throughout Complications: No apparent complications Patient did tolerate procedure well. Bilateral Breath Sounds: Clear   Yes   Patient extubated per MD. Positive cuff leak. Able to vocalize and clear secretions. Currently on 4 LNC  Bonnie Tran, Bonnie Tran 01/08/2017, 12:34 PM

## 2017-01-08 NOTE — Progress Notes (Signed)
SLP Cancellation Note  Patient Details Name: Tylene FantasiaDawn Naron MRN: [id number] DOB: 03/03/1976   Cancelled treatment:       Reason Eval/Treat Not Completed: Patient not medically ready. Pt remains intubated on ventilator. SLP will f/u.  Rondel BatonMary Beth Zailah Zagami, [place] CF-SLP Speech-Language Pathologist    Arlana LindauMary E Tammala Weider 01/08/2017, 8:16 AM

## 2017-01-08 NOTE — Progress Notes (Addendum)
PULMONARY / CRITICAL CARE MEDICINE   Name: Bonnie Tran MRN: [id number] DOB: 08-19-1976    ADMISSION DATE:  01/06/2017 CONSULTATION DATE:  01/07/17   REFERRING MD  CHIEF COMPLAINT:  HISTORY OF PRESENT ILLNESS:   Bonnie Tran is a 41 year old female who presents for [facility] s/p left posterior communicating artery aneurysm.  Patient is intubated sedated. No family present at bedside. Much of history is obtained per prior notes.   Patient developed sudden onset headache, presented to ED and found to have intracranial abnormality. She was taken to OR for clipping. It remains unclear to my why she is intubated in ICU. She does seem to awaken on sedation holiday but is not responsive to commands. Patient is normotensive, bilateral breath sounds, SCDs on bilateral legs.     VITAL SIGNS: BP 136/82   Pulse 83   Temp (!) 100.8 F (38.2 C) (Axillary)   Resp 18   Ht 5\' 4"  (1.626 m)   Wt 134 lb 7.7 oz (61 kg)   LMP 01/06/2017 (Exact Date)   SpO2 100%   BMI 23.08 kg/m   HEMODYNAMICS:    VENTILATOR SETTINGS: Vent Mode: CPAP;PSV FiO2 (%):  [40 %-50 %] 40 % Set Rate:  [12 bmp] 12 bmp Vt Set:  [400 mL-500 mL] 400 mL PEEP:  [5 cmH20] 5 cmH20 Pressure Support:  [5 cmH20] 5 cmH20 Plateau Pressure:  [7 cmH20-15 cmH20] 7 cmH20  INTAKE / OUTPUT: I/O last 3 completed shifts: In: 4747.7 [I.V.:4092.7; IV Piggyback:655] Out: 3210 [Urine:2700; Blood:510]  PHYSICAL EXAMINATION: Physical Exam: Temp:  [97.7 F (36.5 C)-100.8 F (38.2 C)] 100.8 F (38.2 C) (03/24 0800) Pulse Rate:  [62-83] 83 (03/24 1000) Resp:  [11-28] 18 (03/24 1000) BP: (97-156)/(54-99) 136/82 (03/24 1000) SpO2:  [97 %-100 %] 100 % (03/24 1000) Arterial Line BP: (103-155)/(52-105) 145/66 (03/24 1000) FiO2 (%):  [40 %-50 %] 40 % (03/24 0825) Weight:  [134 lb 7.7 oz (61 kg)] 134 lb 7.7 oz (61 kg) (03/23 2245)   Physical Exam:  General- Intubated , no distress, weaning on CPAP/ PS 40 5/5 ENT: Normocephalic/ Atraumatic, ETT  secure and in place. Cardiac: S1, S2, regular rate and rhythm, no murmur Chest: Bilateral excursion, Clear throughout ; no accessory muscle use, no nasal flaring, no sternal retractions, no wheeze, rales or rhonchi Abd.: Soft Non-tender, BS + x 4, tolerating tube feeds Ext: No clubbing cyanosis, edema Neuro:  Awakens to voice, Follow commands, MAE x 4 Skin: No rashes, warm and dry, brisk refill   LABS:  BMET  Recent Labs Lab 01/07/17 0116  NA 134*  K 3.9  CL 105  CO2 24  BUN 9  CREATININE 0.66  GLUCOSE 102*    Electrolytes  Recent Labs Lab 01/07/17 0116  CALCIUM 8.6*    CBC  Recent Labs Lab 01/07/17 0116  WBC 9.6  HGB 10.5*  HCT 30.9*  PLT 160    Coag's No results for input(s): APTT, INR in the last 168 hours.  Sepsis Markers No results for input(s): LATICACIDVEN, PROCALCITON, O2SATVEN in the last 168 hours.  ABG  Recent Labs Lab 01/07/17 0015  PHART 7.337*  PCO2ART 37.6  PO2ART 237*    Liver Enzymes  Recent Labs Lab 01/07/17 0116  AST 80*  ALT 59*  ALKPHOS 28*  BILITOT 1.0  ALBUMIN 3.6    Cardiac Enzymes No results for input(s): TROPONINI, PROBNP in the last 168 hours.  Glucose No results for input(s): GLUCAP in the last 168 hours.  Imaging  Portable Chest Xray  Result Date: 01/07/2017 CLINICAL DATA:  Endotracheal tube and orogastric tube placement. Initial encounter. EXAM: PORTABLE CHEST 1 VIEW COMPARISON:  None. FINDINGS: The patient's endotracheal tube is seen ending 2-3 cm above the carina. An enteric tube is noted extending below the diaphragm. The lungs are well-aerated and clear. There is no evidence of focal opacification, pleural effusion or pneumothorax. The cardiomediastinal silhouette is within normal limits. No acute osseous abnormalities are seen. IMPRESSION: 1. Endotracheal tube seen ending 2-3 cm above the carina. 2. Enteric tube noted extending below the diaphragm. 3. No acute cardiopulmonary process seen.  Electronically Signed   By: Roanna RaiderJeffery  Chang M.D.   On: 01/07/2017 23:07   CULTURES:  ANTIBIOTICS: none  SIGNIFICANT EVENTS: s/p clipping  LINES/TUBES: ETT, Foley, PIV, Aline  DISCUSSION: 41 year old female with acute IPH 2/2 aneurysmal bleed. CCM consulted for ventilator management.Weaning well on 40%, 5 and 5. Will plan to extubate this afternoon ( 3/24)  ASSESSMENT / PLAN:  PULMONARY A: Intubated for OR case.   P: continue mechanical ventilation with low tidal volume 6-377ml/kg IBW  Continue propofol Tolerating SBT 40%, 5/5 well this am. CXR 3/25   CARDIOVASCULAR A: not active P; continue tele monitoring     Consider dc  A line   RENAL A:  Not active  P: Monitor UO Trend BMET/ Creatinine 3/25 Replete electrolytes as needed  GASTROINTESTINAL A:  Not active  P: SUP>> pepcid every 12 hours   HEMATOLOGIC A:  Not active Plan: CBC daily Transfuse for hgb< 7  INFECTIOUS A:  Not active Plan: Trend fever curve and WBC   ENDOCRINE A:  Not active Plan: CBG's q 4  NEUROLOGIC A:  IPH with PCom aneurysm s/p clipping P:   Management per neurosurgery  FEN Avoid chemical DVT ppx, SCDs on bilaterally Add stress ulcer ppx NPO until swallow confirmed ok. Swallow Eval post extubation  FAMILY  - Updates: family updated  at bedside 3/24 am  - Inter-disciplinary family meet or Palliative Care meeting due by:  day 7  Bevelyn NgoSarah F. Groce, AGACNP-BC [facility]  Pager: [phone]  01/08/2017, 11:16 AM  Attending Note:  I have examined patient, reviewed labs, studies and notes. I have discussed the case with Saralyn PilarS Groce, and I agree with the data and plans as amended above. 41 year old woman with a subarachnoid hemorrhage related to a left posterior communicating artery aneurysm. She ultimately required clipping in the OR and returned to the ICU on sedation and mechanical ventilation. On my evaluation this morning her sedation has been held and  she is awake, interacting, following commands. She has a strong cough. Her respiratory pattern is comfortable on spontaneous breathing trial. Her lungs are clear. Her heart is regular without a murmur. Her head is dressed with a bandage. We will plan next bite her today, push pulmonary high Carney BernJean. Blood pressure control. Continue stress ulcer prophylaxis and DVT prophylaxis with SCD. Independent critical care time is 35 minutes.   Levy Pupaobert Lourdez Mcgahan, MD, PhD 01/08/2017, 4:23 PM [facility] [phone] or if no answer [phone]

## 2017-01-08 NOTE — Progress Notes (Signed)
PT Cancellation Note  Patient Details Name: Bonnie Tran MRN: [id number] DOB: 02-26-1976   Cancelled Treatment:     remains on bedrest at this time   Fabio AsaDevon J Liston Thum 01/08/2017, 8:33 AM

## 2017-01-08 NOTE — Progress Notes (Signed)
Patient ID: Bonnie Tran, female   DOB: 06/24/1976, 41 y.o.   MRN: [id number] Subjective:  The patient is dated and somnolent but easily arousable. Her mother and uncle are at the bedside.  Objective: Vital signs in last 24 hours: Temp:  [97.7 F (36.5 C)-100.8 F (38.2 C)] 100.8 F (38.2 C) (03/24 0800) Pulse Rate:  [62-83] 83 (03/24 1000) Resp:  [11-28] 18 (03/24 1000) BP: (97-156)/(54-99) 136/82 (03/24 1000) SpO2:  [95 %-100 %] 100 % (03/24 1000) Arterial Line BP: (103-155)/(52-105) 145/66 (03/24 1000) FiO2 (%):  [40 %-50 %] 40 % (03/24 0825) Weight:  [61 kg (134 lb 7.7 oz)] 61 kg (134 lb 7.7 oz) (03/23 2245)  Intake/Output from previous day: 03/23 0701 - 03/24 0700 In: 4601.4 [I.V.:3946.4; IV Piggyback:655] Out: 3210 [Urine:2700; Blood:510] Intake/Output this shift: Total I/O In: 105 [IV Piggyback:105] Out: -   Physical exam the patient is Glasgow Coma Scale 10 intubated, [id number]. She follows commands. She moves all 4 extremities. Her pupils are equal. Patient's craniotomy dressing is clean and dry.  Lab Results:  Recent Labs  01/07/17 0116  WBC 9.6  HGB 10.5*  HCT 30.9*  PLT 160   BMET  Recent Labs  01/07/17 0116  NA 134*  K 3.9  CL 105  CO2 24  GLUCOSE 102*  BUN 9  CREATININE 0.66  CALCIUM 8.6*    Studies/Results: Ct Angio Head W Or Wo Contrast  Addendum Date: 01/07/2017   ADDENDUM REPORT: 01/07/2017 01:44 ADDENDUM: The aneurysm described above is actually distal and lateral to the origin of the left ophthalmic artery, rather than at its origin as initially stated. It projects laterally from the carotid terminus, just inferior to the origin of the middle cerebral artery. Electronically Signed   By: Deatra Robinson M.D.   On: 01/07/2017 01:44   Result Date: 01/07/2017 CLINICAL DATA:  Subarachnoid hemorrhage EXAM: CT ANGIOGRAPHY HEAD TECHNIQUE: Multidetector CT imaging of the head was performed using the standard protocol during bolus administration of  intravenous contrast. Multiplanar CT image reconstructions and MIPs were obtained to evaluate the vascular anatomy. 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 This is at the origin of the ophthalmic artery. The right ICA is normal. --Anterior cerebral arteries: There is a persistent median callosal artery, a normal variant. Otherwise normal. --Middle cerebral arteries: Normal. --Posterior communicating arteries: Present bilaterally. Posterior circulation: --Posterior cerebral arteries: Normal. --Superior cerebellar arteries: Normal. --Basilar artery: Normal. --Anterior inferior cerebellar arteries: Normal. --Posterior inferior cerebellar arteries: Normal. Venous sinuses: As permitted by contrast timing, patent. Anatomic variants: None Delayed phase: Vascular enhancement obscures assessment of subtle subarachnoid blood in the sylvian fissures seen on the previous study. Review of the MIP images confirms the above findings IMPRESSION: 1. 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 2. Otherwise normal intracranial arteries. Electronically Signed: By: Deatra Robinson M.D. On: 01/07/2017 01:00   Ct Head Wo Contrast  Result Date: 01/07/2017 CLINICAL DATA:  Acute onset of headache.  Initial encounter. EXAM: CT HEAD WITHOUT CONTRAST TECHNIQUE: Contiguous axial images were obtained from the base of the skull through the vertex without intravenous contrast. COMPARISON:  None. FINDINGS: Brain: Acute subarachnoid hemorrhage is seen tracking along the sylvian fissures bilaterally, and is suggested minimally about the circle of Willis.  This raises suspicion for a ruptured aneurysm, possibly at the anterior communicating artery. The posterior fossa, including the  cerebellum, brainstem and fourth ventricle, is within normal limits. The third and lateral ventricles, and basal ganglia are unremarkable in appearance. The cerebral hemispheres are symmetric in appearance, with normal gray-white differentiation. No midline shift is seen. Vascular: No hyperdense vessel or unexpected calcification. Skull: There is no evidence of fracture; visualized osseous structures are unremarkable in appearance. Sinuses/Orbits: The orbits are within normal limits. The paranasal sinuses and mastoid air cells are well-aerated. Other: No significant soft tissue abnormalities are seen. IMPRESSION: Acute subarachnoid hemorrhage tracking along the sylvian fissures bilaterally, and minimally about the circle of Willis. This raises suspicion for ruptured aneurysm, possibly at the anterior communicating artery. CTA of the head is recommended for further evaluation. Critical Value/emergent results were called by telephone at the time of interpretation on 01/07/2017 at 12:05 am to Dr. Devoria AlbeIVA KNAPP, who verbally acknowledged these results. Electronically Signed   By: Roanna RaiderJeffery  Chang M.D.   On: 01/07/2017 00:09   Portable Chest Xray  Result Date: 01/07/2017 CLINICAL DATA:  Endotracheal tube and orogastric tube placement. Initial encounter. EXAM: PORTABLE CHEST 1 VIEW COMPARISON:  None. FINDINGS: The patient's endotracheal tube is seen ending 2-3 cm above the carina. An enteric tube is noted extending below the diaphragm. The lungs are well-aerated and clear. There is no evidence of focal opacification, pleural effusion or pneumothorax. The cardiomediastinal silhouette is within normal limits. No acute osseous abnormalities are seen. IMPRESSION: 1. Endotracheal tube seen ending 2-3 cm above the carina. 2. Enteric tube noted extending below the diaphragm. 3. No acute cardiopulmonary process seen. Electronically Signed   By: Roanna RaiderJeffery  Chang M.D.   On: 01/07/2017 23:07    Assessment/Plan: Postop day #1: The  patient is doing well clinically. I have answered all the patient's family's questions. Critical care is managing her ventilator and decide whether she can be extubated.  LOS: 1 day     Odelle Kosier D 01/08/2017, 10:30 AM

## 2017-01-08 NOTE — Progress Notes (Signed)
Physician notified of pt blood pressure. No change in neurological examine.

## 2017-01-08 NOTE — Evaluation (Signed)
Clinical/Bedside Swallow Evaluation Patient Details  Name: Bonnie Tran MRN: [id number] Date of Birth: 07-31-1976  Today's Date: 01/08/2017 Time: SLP Start Time (ACUTE ONLY): 1658 SLP Stop Time (ACUTE ONLY): 1712 SLP Time Calculation (min) (ACUTE ONLY): 14 min  Past Medical History: History reviewed. No pertinent past medical history. Past Surgical History: History reviewed. No pertinent surgical history. HPI:  Bonnie Tran a 41 y.o.femalewho presented 01/06/17 with sudden onset, severe, throbbing HAwith associated nausea.  Per pt, she was doing shoulder presses at the gym, when she suddenly began to experience an intense throbbing pain in her posterior neck/head. 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 She is s/p left pterional craniotomy, clipping of posterior communicating artery aneurysm. She was intubated 01/06/17 to 01/08/17. Referred for bedside swallowing evaluation, cognitive-linguistic evaluation.   Assessment / Plan / Recommendation Clinical Impression  Patient presents with appearance of adequate airway protection with thin liquids, pureed and regular solids. No overt signs or symptoms of aspiration observed despite challenging with multiple consecutive boluses of thin liquids in excess of 3 oz. Oral manipulation, bolus clearance adequate, swallow appears timely. Patient with very low vocal intensity; she is phonating, but appears to be limiting volume due to headache, jaw pain. She does complain of "TMJ" and jaw pain on her left side; jaw ROM is less than 20mm. Initially declines solids, but with encouragement from her family masticates small amount of regular solid on R side. Recommend initiating dys 2 diet with thin liquids, medications whole in puree (may crush if jaw opening limits bolus acceptance). SLP will f/u for tolerance, advancement as appropriate, as well as cognitive-linguistic assessment.  SLP Visit Diagnosis: Dysphagia, unspecified (R13.10)    Aspiration  Risk  Mild aspiration risk    Diet Recommendation Dysphagia 2 (Fine chop);Thin liquid   Liquid Administration via: Cup;Straw Medication Administration: Whole meds with puree Supervision: Staff to assist with self feeding Compensations: Slow rate;Small sips/bites Postural Changes: Seated upright at 90 degrees    Other  Recommendations Oral Care Recommendations: Oral care BID   Follow up Recommendations Other (comment) (TBD)      Frequency and Duration min 2x/week  2 weeks       Prognosis Prognosis for Safe Diet Advancement: Good      Swallow Study   General Date of Onset: 01/06/17 HPI: Bonnie Tran a 41 y.o.femalewho presented 01/06/17 with sudden onset, severe, throbbing HAwith associated nausea.  Per pt, she was doing shoulder presses at the gym, when she suddenly began to experience an intense throbbing pain in her posterior neck/head. 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 She is s/p left pterional craniotomy, clipping of posterior communicating artery aneurysm. She was intubated 01/06/17 to 01/08/17. Referred for bedside swallowing evaluation, cognitive-linguistic evaluation. Type of Study: Bedside Swallow Evaluation Previous Swallow Assessment: none in chart Diet Prior to this Study: NPO Temperature Spikes Noted: No Respiratory Status: Room air History of Recent Intubation: Yes Length of Intubations (days): 2 days Date extubated: 01/08/17 Behavior/Cognition: Alert;Cooperative Oral Cavity Assessment: Other (comment) (Limited assessment, pt with jaw opening restricted 2/2 pain) Oral Care Completed by SLP: Yes Oral Cavity - Dentition: Adequate natural dentition;Other (Comment) (braces) Vision: Functional for self-feeding Self-Feeding Abilities: Needs assist;Needs set up Patient Positioning: Upright in bed Baseline Vocal Quality: Low vocal intensity Volitional Cough: Strong Volitional Swallow: Able to elicit    Oral/Motor/Sensory Function Overall  Oral Motor/Sensory Function: Within functional limits   Ice Chips Ice chips: Within functional limits Presentation: Spoon   Thin Liquid Thin Liquid:  Within functional limits Presentation: Cup;Straw;Self Fed    Nectar Thick Nectar Thick Liquid: Not tested   Honey Thick Honey Thick Liquid: Not tested   Puree Puree: Within functional limits Presentation: Spoon   Solid   GO   Solid: Impaired Oral Phase Impairments: Impaired mastication Oral Phase Functional Implications: Impaired mastication        Bonnie LindauMary E Shabazz Tran 01/08/2017,5:24 PM Bonnie BatonMary Beth Brenlyn Beshara, MS CF-SLP Speech-Language Pathologist [phone]

## 2017-01-09 ENCOUNTER — Inpatient Hospital Stay (HOSPITAL_COMMUNITY): Payer: BC Managed Care – PPO

## 2017-01-09 ENCOUNTER — Encounter (HOSPITAL_COMMUNITY): Payer: Self-pay | Admitting: Radiology

## 2017-01-09 DIAGNOSIS — J9601 Acute respiratory failure with hypoxia: Secondary | ICD-10-CM

## 2017-01-09 LAB — BASIC METABOLIC PANEL
Anion gap: 5 (ref 5–15)
BUN: <5 mg/dL — ABNORMAL LOW (ref 6–20)
CALCIUM: 8.1 mg/dL — AB (ref 8.9–10.3)
CO2: 25 mmol/L (ref 22–32)
CREATININE: 0.64 mg/dL (ref 0.44–1.00)
Chloride: 108 mmol/L (ref 101–111)
Glucose, Bld: 104 mg/dL — ABNORMAL HIGH (ref 65–99)
Potassium: 3.6 mmol/L (ref 3.5–5.1)
SODIUM: 138 mmol/L (ref 135–145)

## 2017-01-09 LAB — CBC
HCT: 26.5 % — ABNORMAL LOW (ref 36.0–46.0)
HEMOGLOBIN: 8.5 g/dL — AB (ref 12.0–15.0)
MCH: 28.6 pg (ref 26.0–34.0)
MCHC: 32.1 g/dL (ref 30.0–36.0)
MCV: 89.2 fL (ref 78.0–100.0)
PLATELETS: 136 10*3/uL — AB (ref 150–400)
RBC: 2.97 MIL/uL — ABNORMAL LOW (ref 3.87–5.11)
RDW: 12.7 % (ref 11.5–15.5)
WBC: 8 10*3/uL (ref 4.0–10.5)

## 2017-01-09 MED ORDER — SODIUM CHLORIDE 0.9 % IV BOLUS (SEPSIS)
500.0000 mL | Freq: Once | INTRAVENOUS | Status: AC
  Administered 2017-01-09: 500 mL via INTRAVENOUS

## 2017-01-09 NOTE — Progress Notes (Signed)
PULMONARY / CRITICAL CARE MEDICINE   Name: Leith Hedlund MRN: [id number] DOB: [birth year]    ADMISSION DATE:  01/06/2017 CONSULTATION DATE:  01/07/17   REFERRING MD: Venetia Maxon  CHIEF COMPLAINT:  HISTORY OF PRESENT ILLNESS:   Mrs. Stammen is a 41 year old female who presents for [facility] s/p left posterior communicating artery aneurysm.  Patient is intubated sedated. No family present at bedside. Much of history is obtained per prior notes.   Patient developed sudden onset headache, presented to ED and found to have intracranial abnormality. She was taken to OR for clipping. It remains unclear to my why she is intubated in ICU. She does seem to awaken on sedation holiday but is not responsive to commands. Patient is normotensive, bilateral breath sounds, SCDs on bilateral legs.     VITAL SIGNS: BP (!) 99/58   Pulse 64   Temp 98.4 F (36.9 C) (Oral)   Resp 18   Ht 5\' 4"  (1.626 m)   Wt 134 lb 7.7 oz (61 kg)   LMP 01/06/2017 (Exact Date)   SpO2 97%   BMI 23.08 kg/m   HEMODYNAMICS:    VENTILATOR SETTINGS:    INTAKE / OUTPUT: I/O last 3 completed shifts: In: 5235.8 [I.V.:4480.8; IV Piggyback:755] Out: 5450 [Urine:4950; Blood:500]  PHYSICAL EXAMINATION: Physical Exam: Temp:  [97.9 F (36.6 C)-99.2 F (37.3 C)] 98.4 F (36.9 C) (03/25 1200) Pulse Rate:  [52-91] 64 (03/25 1200) Resp:  [16-37] 18 (03/25 1200) BP: (91-129)/(38-90) 99/58 (03/25 1200) SpO2:  [96 %-100 %] 97 % (03/25 1200) Arterial Line BP: (76-149)/(39-77) 102/53 (03/25 0400)   Physical Exam:  General- Lethargic female with bandaged head , follows commands, swollen left eye ENT: Normocephalic, with edema to L eye Cardiac: S1, S2, regular rate and rhythm, no murmur Chest: Bilateral excursion, Clear throughout ; no accessory muscle use, no nasal flaring, no sternal retractions, no wheeze, rales or rhonchi. Tolerating room air Abd.: Soft, flat  Non-tender, BS + x 4 Ext: No clubbing cyanosis, edema Neuro:  Awakens to  voice, Follow commands, MAE x 4, is lethargic, Oriented x 4 Skin: No rashes, warm and dry, brisk refill   LABS:  BMET  Recent Labs Lab 01/07/17 0116 01/09/17 0358  NA 134* 138  K 3.9 3.6  CL 105 108  CO2 24 25  BUN 9 <5*  CREATININE 0.66 0.64  GLUCOSE 102* 104*    Electrolytes  Recent Labs Lab 01/07/17 0116 01/09/17 0358  CALCIUM 8.6* 8.1*    CBC  Recent Labs Lab 01/07/17 0116 01/09/17 0358  WBC 9.6 8.0  HGB 10.5* 8.5*  HCT 30.9* 26.5*  PLT 160 136*    Coag's No results for input(s): APTT, INR in the last 168 hours.  Sepsis Markers No results for input(s): LATICACIDVEN, PROCALCITON, O2SATVEN in the last 168 hours.  ABG  Recent Labs Lab 01/07/17 0015  PHART 7.337*  PCO2ART 37.6  PO2ART 237*    Liver Enzymes  Recent Labs Lab 01/07/17 0116  AST 80*  ALT 59*  ALKPHOS 28*  BILITOT 1.0  ALBUMIN 3.6    Cardiac Enzymes No results for input(s): TROPONINI, PROBNP in the last 168 hours.  Glucose No results for input(s): GLUCAP in the last 168 hours.  Imaging Dg Chest Port 1 View  Result Date: 01/09/2017 CLINICAL DATA:  Respiratory failure EXAM: PORTABLE CHEST 1 VIEW COMPARISON:  January 07, 2017 FINDINGS: Endotracheal tube and nasogastric tube have been removed. No pneumothorax. Lungs are clear. Heart is upper normal in size with  pulmonary vascularity within normal limits. No adenopathy. No bone lesions. IMPRESSION: No pneumothorax.  No edema or consolidation. Electronically Signed   By: Bretta BangWilliam  Woodruff III M.D.   On: 01/09/2017 07:06   CULTURES:  ANTIBIOTICS: none  SIGNIFICANT EVENTS: s/p clipping  LINES/TUBES: ETT, Foley, PIV, Aline  DISCUSSION: [age] year old female with acute IPH 2/2 aneurysmal bleed. CCM consulted for ventilator management.Weaning well on 40%, 5 and 5. Will plan to extubate this afternoon ( 3/24)  ASSESSMENT / PLAN:  PULMONARY A: Intubated for OR case.   P:  Extubated 3/24 Tolerating Room Air with sats  99% CXR prn Will need aggressive Pulmonary Toilet/ IS  Mobilize as soon as cleared to do so by Neuro surgery   CARDIOVASCULAR A: not active P; continue tele monitoring        RENAL A:  Not active  P: Monitor UO Trend BMET/ Creatinine 3/25 Replete electrolytes as needed  GASTROINTESTINAL A:  Not active  P: SUP>> pepcid every 12 hours Swallow eval per speech ok for Dysphagi 2  With thin liquids.   HEMATOLOGIC A:  Anemia Plan: CBC daily Transfuse for hgb< 7  INFECTIOUS A:  Not active Plan: Trend fever curve and WBC   ENDOCRINE A:  Not active Plan: CBG's q 4  NEUROLOGIC A:  IPH with PCom aneurysm s/p clipping Pt. Is lethargic but following commands and is oriented to self, place and family/ staff. P:   Management per neurosurgery  FEN Avoid chemical DVT ppx, SCDs on bilaterally Add stress ulcer ppx NPO until swallow confirmed ok. Swallow Eval post extubation  FAMILY  - Updates: family updated  at bedside 3/25 am  - Inter-disciplinary family meet or Palliative Care meeting due by:  day 7  Pt. Has been liberated from mechanical ventilation, and is tolerating RA with  Saturations of 99-100% with no distress noted. CXR 3/25 indicates no acute cardiopulmonary process.Marland Kitchen. CCM will sign off for now. Please do not hesitate to re-consult if we can contribute further to Ms. Earlene PlaterDavis' care.Thank you.  Bevelyn NgoSarah F. Groce, AGACNP-BC [facility]  Pager: [phone]  01/09/2017, 12:57 PM  Attending Note:  I have examined patient, reviewed labs, studies and notes. I have discussed the case with Saralyn PilarS Groce, and I agree with the data and plans as amended above. Patient doing well postextubation, now on room air. She is comfortable, cranial dressing intact. She wakes to voice and interacts. Lungs are clear, heart regular. No further recommendations at this time. Please call if we can assist  Levy Pupaobert Shakira Los, MD, PhD 01/09/2017, 6:36 PM [facility] [phone] or if no answer [phone]

## 2017-01-09 NOTE — Anesthesia Postprocedure Evaluation (Signed)
Anesthesia Post Note  Patient: Bonnie Tran  Procedure(s) Performed: Procedure(s) (LRB): RADIOLOGY WITH ANESTHESIA (N/A)  Patient location during evaluation: Radiology Anesthesia Type: General Level of consciousness: patient remains intubated per anesthesia plan Vital Signs Assessment: post-procedure vital signs reviewed and stable Respiratory status: patient remains intubated per anesthesia plan Cardiovascular status: stable Anesthetic complications: no       Last Vitals:  Vitals:   01/09/17 0700 01/09/17 0800  BP: (!) 91/51 104/61  Pulse: 69 67  Resp: 19 18  Temp:  36.8 C    Last Pain:  Vitals:   01/09/17 0800  TempSrc: Oral  PainSc: 7                  Theodore Virgin

## 2017-01-09 NOTE — Addendum Note (Signed)
Addendum  created 01/09/17 0912 by Dorris Singhharlene Elek Holderness, MD   Anesthesia Attestations filed

## 2017-01-09 NOTE — Progress Notes (Signed)
Rehab Admissions Coordinator Note:  Patient was screened by Trish MageLogue, Mamoudou Mulvehill M for appropriateness for an Inpatient Acute Rehab Consult.  Noted SLP recommending inpatient rehab consult.  Will await PT/OT evals and recommendations to determine functional rehab needs.  Call me for questions.  Trish MageLogue, Zaylie Gisler M 01/09/2017, 3:55 PM  I can be reached at [phone].

## 2017-01-09 NOTE — Progress Notes (Signed)
Patient ID: Bonnie Tran, female   DOB: 03-Jul-1976, 41 y.o.   MRN: [id number] Subjective:  The patient is alert and pleasant. Family members are at the bedside. She looks well.  Objective: Vital signs in last 24 hours: Temp:  [97.9 F (36.6 C)-99.8 F (37.7 C)] 98.3 F (36.8 C) (03/25 0800) Pulse Rate:  [52-91] 67 (03/25 0800) Resp:  [17-37] 18 (03/25 0800) BP: (91-160)/(38-90) 104/61 (03/25 0800) SpO2:  [96 %-100 %] 98 % (03/25 0800) Arterial Line BP: (76-161)/(39-77) 102/53 (03/25 0400) FiO2 (%):  [40 %] 40 % (03/24 1141)  Intake/Output from previous day: 03/24 0701 - 03/25 0700 In: 1914.4 [I.V.:1709.4; IV Piggyback:205] Out: 3100 [Urine:3100] Intake/Output this shift: Total I/O In: 345 [P.O.:240; IV Piggyback:105] Out: -   Physical exam the patient is alert and oriented 3. She is moving all 4 extremities well. Her left eye is swollen shut. Her speech is normal.  Lab Results:  Recent Labs  01/07/17 0116 01/09/17 0358  WBC 9.6 8.0  HGB 10.5* 8.5*  HCT 30.9* 26.5*  PLT 160 136*   BMET  Recent Labs  01/07/17 0116 01/09/17 0358  NA 134* 138  K 3.9 3.6  CL 105 108  CO2 24 25  GLUCOSE 102* 104*  BUN 9 <5*  CREATININE 0.66 0.64  CALCIUM 8.6* 8.1*    Studies/Results: Dg Chest Port 1 View  Result Date: 01/09/2017 CLINICAL DATA:  Respiratory failure EXAM: PORTABLE CHEST 1 VIEW COMPARISON:  January 07, 2017 FINDINGS: Endotracheal tube and nasogastric tube have been removed. No pneumothorax. Lungs are clear. Heart is upper normal in size with pulmonary vascularity within normal limits. No adenopathy. No bone lesions. IMPRESSION: No pneumothorax.  No edema or consolidation. Electronically Signed   By: Bretta BangWilliam  Woodruff III M.D.   On: 01/09/2017 07:06   Portable Chest Xray  Result Date: 01/07/2017 CLINICAL DATA:  Endotracheal tube and orogastric tube placement. Initial encounter. EXAM: PORTABLE CHEST 1 VIEW COMPARISON:  None. FINDINGS: The patient's endotracheal tube is  seen ending 2-3 cm above the carina. An enteric tube is noted extending below the diaphragm. The lungs are well-aerated and clear. There is no evidence of focal opacification, pleural effusion or pneumothorax. The cardiomediastinal silhouette is within normal limits. No acute osseous abnormalities are seen. IMPRESSION: 1. Endotracheal tube seen ending 2-3 cm above the carina. 2. Enteric tube noted extending below the diaphragm. 3. No acute cardiopulmonary process seen. Electronically Signed   By: Roanna RaiderJeffery  Chang M.D.   On: 01/07/2017 23:07    Assessment/Plan: Postop day #2: The patient is doing well clinically.  LOS: 2 days     Khaliya Golinski D 01/09/2017, 8:53 AM

## 2017-01-09 NOTE — Evaluation (Signed)
Speech Language Pathology Evaluation Patient Details Name: Bonnie Tran MRN: [id number] DOB: 1976-06-19 Today's Date: 01/09/2017 Time: 4098-11911216-1235 SLP Time Calculation (min) (ACUTE ONLY): 19 min  Problem List:  Patient Active Problem List   Diagnosis Date Noted  . SAH (subarachnoid hemorrhage) (HCC) 01/07/2017  . Subarachnoid bleed (HCC) 01/07/2017   Past Medical History: History reviewed. No pertinent past medical history. Past Surgical History: History reviewed. No pertinent surgical history. HPI:  Bonnie Tran a 41 y.o.femalewho presented 01/06/17 with sudden onset, severe, throbbing HAwith associated nausea.  Per pt, she was doing shoulder presses at the gym, when she suddenly began to experience an intense throbbing pain in her posterior neck/head. 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 She is s/p left pterional craniotomy, clipping of posterior communicating artery aneurysm. She was intubated 01/06/17 to 01/08/17. Referred for bedside swallowing evaluation, cognitive-linguistic evaluation.   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 Administered MoCA form 7.1; patient scored 24/30 (26 or above is normal). 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 She states she has always been soft-spoken, however her tone and affect are quite flat. She had difficulty with delayed recall (2/5), serial subtraction (1/5), figure copy and clock drawing tasks. Pt does seem emergently aware of deficits, states, "I'm disappointed," and "I could have done that before" re: serial subtraction. Recommending skilled SLP to address above cognitive communication deficits. [facility]/PT recommendations.     SLP Assessment  SLP Recommendation/Assessment: Patient needs  continued Speech Lanaguage Pathology Services SLP Visit Diagnosis: Cognitive communication deficit (R41.841)    Follow Up Recommendations  Inpatient Rehab;Outpatient SLP    Frequency and Duration min 2x/week  2 weeks      SLP Evaluation Cognition  Overall Cognitive Status: Impaired/Different from baseline Arousal/Alertness: Awake/alert Orientation Level: Oriented X4 Attention: Focused;Sustained;Selective Focused Attention: Appears intact Sustained Attention: Appears intact Selective Attention: Appears intact Memory: Impaired Memory Impairment: Decreased recall of new information;Storage deficit Awareness: Appears intact Problem Solving: Impaired Problem Solving Impairment: Functional complex;Verbal complex Executive Function: Organizing;Sequencing Sequencing: Impaired Sequencing Impairment: Verbal basic;Functional basic Organizing: Impaired Organizing Impairment: Verbal basic;Functional basic Safety/Judgment: Appears intact       Comprehension  Auditory Comprehension Overall Auditory Comprehension: Appears within functional limits for tasks assessed Yes/No Questions: Within Functional Limits Commands: Within Functional Limits Conversation: Simple Interfering Components: Pain Visual Recognition/Discrimination Discrimination: Within Function Limits Reading Comprehension Reading Status: Not tested    Expression Expression Primary Mode of Expression: Verbal Verbal Expression Overall Verbal Expression: Appears within functional limits for tasks assessed Initiation: No impairment Automatic Speech: Name;Social Response Level of Generative/Spontaneous Verbalization: Sentence Repetition: No impairment Naming: No impairment Pragmatics: Impairment Impairments: Abnormal affect;Eye contact;Monotone Non-Verbal Means of Communication: Not applicable Written Expression Dominant Hand: Right Written Expression: Not tested   Oral / Motor  Oral Motor/Sensory Function Overall  Oral Motor/Sensory Function: Within functional limits Motor Speech Overall Motor Speech: Appears within functional limits for tasks assessed Respiration: Within functional limits Phonation: Low vocal intensity Resonance: Within functional limits Articulation: Within functional limitis Intelligibility: Intelligible Motor Planning: Witnin functional limits Motor Speech Errors: Not applicable   GO                   Rondel BatonMary Beth Ahnna Dungan, MS CF-SLP Speech-Language Pathologist [phone]  Arlana LindauMary E Nianna Igo 01/09/2017, 1:40 PM

## 2017-01-09 NOTE — Progress Notes (Signed)
  Speech Language Pathology Treatment: Dysphagia  Patient Details Name: Tylene FantasiaDawn Dommer MRN: [id number] DOB: [birth year] Today's Date: 01/09/2017 Time: 1202-1215 SLP Time Calculation (min) (ACUTE ONLY): 13 min  Assessment / Plan / Recommendation Clinical Impression  Patient seen for dysphagia treatment, diet tolerance. She is alert and more conversant today. With modified independence, pt is able to self-feed thin liquids via straw, soft chopped solids via teaspoon. Jaw ROM appears slightly improved today; with verbal cue x1 opens oral cavity enough for retrieval of 1/2 inch soft cubed bolus. Mastication mildly prolonged, oral clearance adequate, no overt signs of aspiration noted. Recommend continuing dys 2 diet with thin liquids, medications can be given whole with liquid. SLP to f/u for advancement of solids as appropriate.    HPI HPI: Lottie DawsonDawn Davisis a 41 y.o.femalewho presented 01/06/17 with sudden onset, severe, throbbing HAwith associated nausea.  Per pt, she was doing shoulder presses at the gym, when she suddenly began to experience an intense throbbing pain in her posterior neck/head. 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 She is s/p left pterional craniotomy, clipping of posterior communicating artery aneurysm. She was intubated 01/06/17 to 01/08/17. Referred for bedside swallowing evaluation, cognitive-linguistic evaluation.      SLP Plan  Continue with current plan of care       Recommendations  Diet recommendations: Dysphagia 2 (fine chop);Thin liquid Liquids provided via: Cup;Straw Medication Administration: Whole meds with liquid Supervision: Patient able to self feed Compensations: Slow rate;Small sips/bites Postural Changes and/or Swallow Maneuvers: Seated upright 90 degrees                Oral Care Recommendations: Oral care BID Follow up Recommendations: Other (comment) (TBD) SLP Visit Diagnosis: Dysphagia, unspecified (R13.10) Plan: Continue with  current plan of care       GO               Rondel BatonMary Beth Delesa Kawa, MS CF-SLP Speech-Language Pathologist [phone]  Arlana LindauMary E Kerria Sapien 01/09/2017, 1:16 PM

## 2017-01-09 NOTE — Progress Notes (Signed)
PT Cancellation Note  Patient Details Name: Bonnie Tran MRN: [id number] DOB: 28-May-1976   Cancelled Treatment:    Reason Eval/Treat Not Completed: Patient not medically ready, remains on bedrest orders   Fabio AsaDevon J Harkirat Orozco 01/09/2017, 7:52 AM

## 2017-01-10 ENCOUNTER — Inpatient Hospital Stay (HOSPITAL_COMMUNITY): Payer: BC Managed Care – PPO

## 2017-01-10 ENCOUNTER — Encounter (HOSPITAL_COMMUNITY): Payer: Self-pay | Admitting: Neurosurgery

## 2017-01-10 DIAGNOSIS — I671 Cerebral aneurysm, nonruptured: Secondary | ICD-10-CM

## 2017-01-10 LAB — BASIC METABOLIC PANEL
ANION GAP: 6 (ref 5–15)
BUN: <5 mg/dL — ABNORMAL LOW (ref 6–20)
CALCIUM: 8.2 mg/dL — AB (ref 8.9–10.3)
CHLORIDE: 106 mmol/L (ref 101–111)
CO2: 25 mmol/L (ref 22–32)
CREATININE: 0.53 mg/dL (ref 0.44–1.00)
GFR calc Af Amer: >60 mL/min (ref >60)
GFR calc non Af Amer: >60 mL/min (ref >60)
GLUCOSE: 98 mg/dL (ref 65–99)
Potassium: 4 mmol/L (ref 3.5–5.1)
Sodium: 137 mmol/L (ref 135–145)

## 2017-01-10 LAB — CBC
HEMATOCRIT: 26.5 % — AB (ref 36.0–46.0)
Hemoglobin: 8.5 g/dL — ABNORMAL LOW (ref 12.0–15.0)
MCH: 28.8 pg (ref 26.0–34.0)
MCHC: 32.1 g/dL (ref 30.0–36.0)
MCV: 89.8 fL (ref 78.0–100.0)
Platelets: 128 10*3/uL — ABNORMAL LOW (ref 150–400)
RBC: 2.95 MIL/uL — ABNORMAL LOW (ref 3.87–5.11)
RDW: 12.7 % (ref 11.5–15.5)
WBC: 7.1 10*3/uL (ref 4.0–10.5)

## 2017-01-10 MED ORDER — FENTANYL CITRATE (PF) 100 MCG/2ML IJ SOLN
50.0000 ug | INTRAMUSCULAR | Status: DC | PRN
  Administered 2017-01-10 – 2017-01-11 (×3): 50 ug via INTRAVENOUS
  Filled 2017-01-10 (×3): qty 2

## 2017-01-10 MED ORDER — SODIUM CHLORIDE 0.9 % IV BOLUS (SEPSIS)
1000.0000 mL | Freq: Once | INTRAVENOUS | Status: AC
  Administered 2017-01-10: 1000 mL via INTRAVENOUS

## 2017-01-10 MED FILL — Thrombin For Soln 5000 Unit: CUTANEOUS | Qty: 5000 | Status: AC

## 2017-01-10 NOTE — Progress Notes (Signed)
OT Cancellation Note  Patient Details Name: Tylene FantasiaDawn Verga MRN: [id number] DOB: [birth year]   Cancelled Treatment:    Reason Eval/Treat Not Completed: Other (comment). Pt has active bedrest orders. Please update activity orders to initiate therapy. thanks  Digestive Disease CenterWARD,HILLARY  Aashika Carta, OT/L  [phone] 01/10/2017 01/10/2017, 6:55 AM

## 2017-01-10 NOTE — Evaluation (Signed)
Physical Therapy Evaluation Patient Details Name: Bonnie Tran MRN: [id number] DOB: 05-02-1976 Today's Date: 01/10/2017   History of Present Illness  pt presents post L PCA Aneurysm and SAH, now s/p L Pterional Crani and Coiling.  pt intubated 3/22 - 3/24.  No PMH.    Clinical Impression  Pt very pleasant and motivated to work with therapy.  Pt able to ambulate within room with only guarding A, however photophobia limiting pt being able to ambulate into hallway.  Encouraged pt to have family bring in sunglasses if sensitivity persists.  Feel pt will make great progress with mobility and may not need any f/u therapy at D/C.  Will continue to follow.      Follow Up Recommendations Outpatient PT;Supervision/Assistance - 24 hour    Equipment Recommendations  None recommended by PT    Recommendations for Other Services       Precautions / Restrictions Precautions Precautions: Fall Precaution Comments: Light sensitivity. Restrictions Weight Bearing Restrictions: No      Mobility  Bed Mobility Overal bed mobility: Needs Assistance Bed Mobility: Supine to Sit     Supine to sit: Supervision     General bed mobility comments: pt needs increased time, but is able to complete without A.  Cues to continue to scoot closer to EOB.  Transfers Overall transfer level: Needs assistance Equipment used: 1 person hand held assist Transfers: Sit to/from Stand Sit to Stand: Min guard         General transfer comment: cues for UE use as pt with very guarded posture.    Ambulation/Gait Ambulation/Gait assistance: Min guard Ambulation Distance (Feet): 20 Feet Assistive device: 1 person hand held assist Gait Pattern/deviations: Step-through pattern;Decreased stride length     General Gait Details: pt moves slowly and with guarded gait.  pt declined ambulating out of room due to sensitivity to light.    Stairs            Wheelchair Mobility    Modified Rankin (Stroke Patients  Only)       Balance Overall balance assessment: Needs assistance Sitting-balance support: No upper extremity supported;Feet supported Sitting balance-Leahy Scale: Good     Standing balance support: Single extremity supported;No upper extremity supported;During functional activity Standing balance-Leahy Scale: Fair                               Pertinent Vitals/Pain Pain Assessment: Faces Faces Pain Scale: Hurts even more Pain Location: Headache and Low back ache Pain Descriptors / Indicators: Aching;Headache Pain Intervention(s): Monitored during session;Patient requesting pain meds-RN notified    Home Living Family/patient expects to be discharged to:: Private residence Living Arrangements: Other relatives (Aunt and Education officer, community) Available Help at Discharge: Family;Available 24 hours/day Type of Home: House Home Access: Ramped entrance     Home Layout: Two level;Able to live on main level with bedroom/bathroom Home Equipment: None      Prior Function Level of Independence: Independent               Hand Dominance   Dominant Hand: Right    Extremity/Trunk Assessment   Upper Extremity Assessment Upper Extremity Assessment: Defer to OT evaluation    Lower Extremity Assessment Lower Extremity Assessment: Generalized weakness (Sensation intact)    Cervical / Trunk Assessment Cervical / Trunk Assessment: Normal  Communication   Communication: No difficulties (Soft spoken)  Cognition Arousal/Alertness: Awake/alert Behavior During Therapy: WFL for tasks assessed/performed Overall Cognitive Status: Within  Functional Limits for tasks assessed                                        General Comments      Exercises     Assessment/Plan    PT Assessment Patient needs continued PT services  PT Problem List Decreased strength;Decreased activity tolerance;Decreased balance;Decreased mobility;Decreased coordination;Decreased knowledge of  use of DME;Pain       PT Treatment Interventions DME instruction;Gait training;Functional mobility training;Therapeutic activities;Therapeutic exercise;Balance training;Neuromuscular re-education;Patient/family education    PT Goals (Current goals can be found in the Care Plan section)  Acute Rehab PT Goals Patient Stated Goal: Back to normal PT Goal Formulation: With patient Time For Goal Achievement: 01/24/17 Potential to Achieve Goals: Good    Frequency Min 4X/week   Barriers to discharge        Co-evaluation               End of Session Equipment Utilized During Treatment: Gait belt Activity Tolerance: Patient tolerated treatment well Patient left: in chair;with call bell/phone within reach;with family/visitor present Nurse Communication: Mobility status PT Visit Diagnosis: Unsteadiness on feet (R26.81);Muscle weakness (generalized) (M62.81)    Time: 4403-47421323-1350 PT Time Calculation (min) (ACUTE ONLY): 27 min   Charges:   PT Evaluation $PT Eval Moderate Complexity: 1 Procedure PT Treatments $Gait Training: 8-22 mins   PT G CodesSunny Schlein:          Arjan Strohm F Kanitra Purifoy, [place]PT  [phone] 01/10/2017, 3:59 PM

## 2017-01-10 NOTE — Progress Notes (Signed)
  Speech Language Pathology Treatment: Cognitive-Linquistic  Patient Details Name: Bonnie Tran MRN: [id number] DOB: 1976/08/11 Today's Date: 01/10/2017 Time: 4098-11911420-1432 SLP Time Calculation (min) (ACUTE ONLY): 12 min  Assessment / Plan / Recommendation Clinical Impression  SLP had planned to focus on swallowing and executive functioning intervention activity, however pt requesting pain meds and to return to bed (up in chair for first time)r SLP assisted to bed with brief session re: working memory. Educated/discussed use of cell phone to facilitate working and prospective memory. Pt sleepy, eyes closed (some photophobia) and did not request pt practice with phone at this time. Discussed her job/school requirements and that ST would be working on executive functioning/complex cognitive intervention.       HPI HPI: Lottie DawsonDawn Davisis a 41 y.o.femalewho presented 01/06/17 with sudden onset, severe, throbbing HAwith associated nausea.  Per pt, she was doing shoulder presses at the gym, when she suddenly began to experience an intense throbbing pain in her posterior neck/head. 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 She is s/p left pterional craniotomy, clipping of posterior communicating artery aneurysm. She was intubated 01/06/17 to 01/08/17. Referred for bedside swallowing evaluation, cognitive-linguistic evaluation.      SLP Plan  Continue with current plan of care       Recommendations                   General recommendations: Rehab consult Oral Care Recommendations: Oral care BID Follow up Recommendations: Inpatient Rehab SLP Visit Diagnosis: Cognitive communication deficit (Y78.295(R41.841) Plan: Continue with current plan of care       GO                Royce MacadamiaLitaker, Gracin Soohoo Willis 01/10/2017, 3:31 PM  Breck CoonsLisa Willis Creig Landin M.Ed [employer]CCC-SLP Pager [phone]

## 2017-01-10 NOTE — Progress Notes (Signed)
Pt seen and examined. No issues overnight. HA, no other complaints.  EXAM: Temp:  [98.2 F (36.8 C)-98.7 F (37.1 C)] 98.7 F (37.1 C) (03/26 0800) Pulse Rate:  [60-85] 62 (03/26 0700) Resp:  [10-23] 19 (03/26 0700) BP: (91-109)/(46-79) 104/52 (03/26 0700) SpO2:  [96 %-100 %] 96 % (03/26 0700) Intake/Output      03/25 0701 - 03/26 0700 03/26 0701 - 03/27 0700   P.O. 1020    I.V. (mL/kg) 2770 (45.4)    IV Piggyback 255    Total Intake(mL/kg) 4045 (66.3)    Urine (mL/kg/hr) 1675 (1.1)    Total Output 1675     Net +2370           Awake, alert, oriented Speech fluent CN intact, EOMI MAE good strength  LABS: Lab Results  Component Value Date   CREATININE 0.53 01/10/2017   BUN <5 (L) 01/10/2017   NA 137 01/10/2017   K 4.0 01/10/2017   CL 106 01/10/2017   CO2 25 01/10/2017   Lab Results  Component Value Date   WBC 7.1 01/10/2017   HGB 8.5 (L) 01/10/2017   HCT 26.5 (L) 01/10/2017   MCV 89.8 01/10/2017   PLT 128 (L) 01/10/2017     IMPRESSION: - 41 y.o. female SAH d# 3 s/p clipping left Pcom, doing well  PLAN: - Cont supportive care - OOB today - TCD

## 2017-01-10 NOTE — Progress Notes (Signed)
Noted pt BP to drop after giving morphine, 90/50's.  MD called, notified of this RN concern with BP and pt's treatment. Discussed BP trend, previous interventions, and medication. Pt states that Tylenol #3 does not work at all and does not want to take. No change in assessment with low BP.  MD reviewed pt's chart. Instructed to continue to monitor pt's BP and ordered to give fentanyl Q2h prn pain.  Pt updated and will continue to monitor.

## 2017-01-10 NOTE — Progress Notes (Signed)
Dr. Jordan LikesPool aware TCD results available. Orders received for IV fluid bolus.

## 2017-01-10 NOTE — Progress Notes (Signed)
Transcranial Doppler  Date POD PCO2 HCT BP  MCA ACA PCA OPHT SIPH VERT Basilar  01/10/17 MS     Right  Left   159  148   -86  -101   *  43   24  9   *  *   -38  -44   *  *         Right  Left                                            Right  Left                                             Right  Left                                             Right  Left                                            Right  Left                                            Right  Left                                        MCA = Middle Cerebral Artery      OPHT = Opthalmic Artery     BASILAR = Basilar Artery   ACA = Anterior Cerebral Artery     SIPH = Carotid Siphon PCA = Posterior Cerebral Artery   VERT = Verterbral Artery                   Normal MCA = 62+\-12 ACA = 50+\-12 PCA = 42+\-23   01/10/17- Right Lindegaard ratio= 5.7    Left Lindegaard ratio= 4.0 01/10/17- Preliminary results discussed with Dr. Conchita ParisNundkumar and Toma CopierBethany, RN.  01/10/2017 6:04 PM Gertie FeyMichelle Markan Cazarez, BS, RVT, RDCS, RDMS

## 2017-01-11 ENCOUNTER — Other Ambulatory Visit (HOSPITAL_COMMUNITY): Payer: BC Managed Care – PPO

## 2017-01-11 ENCOUNTER — Encounter (HOSPITAL_COMMUNITY): Payer: Self-pay | Admitting: Neurosurgery

## 2017-01-11 MED ORDER — HYDROCODONE-ACETAMINOPHEN 7.5-325 MG PO TABS: 1.0000 | ORAL_TABLET | ORAL | Status: DC | PRN

## 2017-01-11 MED ORDER — HYDROCODONE-ACETAMINOPHEN 5-325 MG PO TABS
1.0000 | ORAL_TABLET | ORAL | Status: DC | PRN
  Administered 2017-01-11 – 2017-01-22 (×14): 1 via ORAL
  Filled 2017-01-11 (×14): qty 1

## 2017-01-11 MED ORDER — MORPHINE SULFATE (PF) 2 MG/ML IV SOLN
1.0000 mg | INTRAVENOUS | Status: DC | PRN
Start: 2017-01-11 — End: 2017-01-11

## 2017-01-11 MED ORDER — MORPHINE SULFATE (PF) 2 MG/ML IV SOLN
1.0000 mg | INTRAVENOUS | Status: DC | PRN
  Administered 2017-01-11 – 2017-01-22 (×26): 1 mg via INTRAVENOUS
  Filled 2017-01-11 (×27): qty 1

## 2017-01-11 NOTE — Care Management Note (Addendum)
Case Management Note  Patient Details  Name: Bonnie Tran MRN: [id number] Date of Birth: [birth year]  Subjective/Objective:  Pt admitted on 01/06/17 with sudden onset HA and diffuse SAH.  PTA, pt independent of ADLS.                    Action/Plan: PT recommending OP follow up.  Aunt and uncle to provide care at discharge.  Will continue to follow for discharge needs.    Addendum 4/7 Lawerance Sabalebbie Ashayla Subia [facility]. Added to AVS.   Expected Discharge Date:                  Expected Discharge Plan:  Home/Self Care  In-House Referral:     Discharge planning Services  CM Consult  Post Acute Care Choice:    Choice offered to:     DME Arranged:    DME Agency:     HH Arranged:    HH Agency:     Status of Service:  In process, will continue to follow  If discussed at Long Length of Stay Meetings, dates discussed:    Additional Comments:  Quintella BatonJulie W. Amerson, RN, BSN  Trauma/Neuro ICU Case Manager [phone]

## 2017-01-11 NOTE — Progress Notes (Signed)
SLP Cancellation Note  Patient Details Name: Bonnie Tran MRN: [id number] DOB: January 17, 1976   Cancelled treatment:        Pt with significant headache pain. Politely refused/unable to participate with OT. ST will defer and reattempt as able.   Royce MacadamiaLitaker, Ariston Grandison Willis 01/11/2017, 2:55 PM   Breck CoonsLisa Willis Lonell FaceLitaker M.Ed [employer]-SLP Pager [phone]

## 2017-01-11 NOTE — Progress Notes (Signed)
OT Cancellation Note  Patient Details Name: Bonnie Tran MRN: [id number] DOB: Dec 13, 1975   Cancelled Treatment:    Reason Eval/Treat Not Completed: Pain limiting ability to participate.  Will try back.  Cataleya Cristina Sumrallonarpe, OTR/L [phone]   Jeani HawkingConarpe, [name] 01/11/2017, 1:01 PM

## 2017-01-11 NOTE — Progress Notes (Signed)
MD ok with SBP 110s+. Will continue to monitor.

## 2017-01-11 NOTE — Progress Notes (Signed)
Patient's BP soft at baseline. Neuro exam unchanged. Per MD, ok for SBP to be in the 100s-120s at this time.

## 2017-01-11 NOTE — Progress Notes (Signed)
qPhysical Therapy Treatment Patient Details Name: Bonnie FantasiaDawn Tran MRN: [id number] DOB: [birth year] Today's Date: 01/11/2017    History of Present Illness pt presents post L PCA Aneurysm and SAH, now s/p L Pterional Crani and Coiling.  pt intubated 3/22 - 3/24.  No PMH.      PT Comments    Pt progressing well toward goals with increase in ambulation distance. Pt requires repeated verbal cues to keep eyes open throughout session due to photophobia despite use of sunglasses and dimming of lights in hallway during ambulation. Current d/c recommendation remains appropriate. PT will continue to follow.    Follow Up Recommendations  Outpatient PT;Supervision/Assistance - 24 hour     Equipment Recommendations  None recommended by PT    Recommendations for Other Services       Precautions / Restrictions Precautions Precautions: Fall Precaution Comments: photophobia-requires max v/c for keeping eyes open Restrictions Weight Bearing Restrictions: No    Mobility  Bed Mobility Overal bed mobility: Needs Assistance Bed Mobility: Sit to Supine       Sit to supine: Supervision   General bed mobility comments: supervision for safety. cues to adjust in bed. increased time  Transfers Overall transfer level: Needs assistance   Transfers: Sit to/from Stand Sit to Stand: Supervision         General transfer comment: supervision for safety. increased time and effort. cues to open eyes  Ambulation/Gait Ambulation/Gait assistance: Min assist Ambulation Distance (Feet): 150 Feet Assistive device: 1 person hand held assist Gait Pattern/deviations: Step-through pattern;Decreased stride length Gait velocity: fluctuated Gait velocity interpretation: Below normal speed for age/gender General Gait Details: gait speed fluctuates. verbal cues for eyes open. Requires minA to navigate hallways due to trouble keeping eyes open despite use of sunglasses and dimming of lights in hallway   Stairs            Wheelchair Mobility    Modified Rankin (Stroke Patients Only) Modified Rankin (Stroke Patients Only) Pre-Morbid Rankin Score: No symptoms Modified Rankin: Moderately severe disability     Balance Overall balance assessment: Needs assistance Sitting-balance support: Feet supported;No upper extremity supported Sitting balance-Leahy Scale: Good     Standing balance support: No upper extremity supported;During functional activity Standing balance-Leahy Scale: Good                              Cognition Arousal/Alertness: Awake/alert Behavior During Therapy: Agitated Overall Cognitive Status: Within Functional Limits for tasks assessed                                 General Comments: agitated with cesation of movement due to c/o of back pain but when asked about back pain states she has none      Exercises      General Comments General comments (skin integrity, edema, etc.): transfer to bathroom in room and able to perform toilet hygeine without assist      Pertinent Vitals/Pain Pain Assessment: No/denies pain, however, pt groaning and complaining about low back pain, but when asked about pain score or face, pt denies pain. Pt also required repeated verbal cues to keep eyes open due to pain in eyes.     Home Living                      Prior Function  PT Goals (current goals can now be found in the care plan section) Acute Rehab PT Goals Patient Stated Goal: back to normal Progress towards PT goals: Progressing toward goals    Frequency    Min 4X/week      PT Plan Current plan remains appropriate    Co-evaluation             End of Session Equipment Utilized During Treatment: Gait belt Activity Tolerance: Patient tolerated treatment well Patient left: in bed;with call bell/phone within reach;with bed alarm set Nurse Communication: Mobility status PT Visit Diagnosis: Unsteadiness on feet  (R26.81);Muscle weakness (generalized) (M62.81);Difficulty in walking, not elsewhere classified (R26.2);Other symptoms and signs involving the nervous system (R29.898)     Time: 4098-1191 PT Time Calculation (min) (ACUTE ONLY): 21 min  Charges:                       G Codes:       Lane Hacker, SPT Acute Rehab SPT [phone]     Lane Hacker 01/11/2017, 4:20 PM

## 2017-01-11 NOTE — Progress Notes (Signed)
Patient reporting 10/10 pain but refused pain medication offered (tylenol 3, fentanyl). D/w MD. Blood pressure currently 120s. Will try Morphine 1 mg IV x 1 and continue to assess BP. Also plan to start Norco prn. Will continue to monitor.

## 2017-01-11 NOTE — Progress Notes (Signed)
Pt seen and examined. No issues overnight. Cont to have HA.  EXAM: Temp:  [98 F (36.7 C)-98.9 F (37.2 C)] 98.9 F (37.2 C) (03/27 0800) Pulse Rate:  [58-81] 76 (03/27 0900) Resp:  [0-28] 21 (03/27 0900) BP: (92-125)/(45-77) 123/77 (03/27 0900) SpO2:  [90 %-100 %] 95 % (03/27 0900) Intake/Output      03/26 0701 - 03/27 0700 03/27 0701 - 03/28 0700   P.O.     I.V. (mL/kg) 3750 (61.5) 300 (4.9)   IV Piggyback 1310 105   Total Intake(mL/kg) 5060 (83) 405 (6.6)   Urine (mL/kg/hr) 2150 (1.5) 700 (4.7)   Total Output 2150 700   Net +2910 -295        Urine Occurrence 2 x     Awake, alert CN grossly intact, EOMI Good strength throughout Wound c/d/i  LABS: Lab Results  Component Value Date   CREATININE 0.53 01/10/2017   BUN <5 (L) 01/10/2017   NA 137 01/10/2017   K 4.0 01/10/2017   CL 106 01/10/2017   CO2 25 01/10/2017   Lab Results  Component Value Date   WBC 7.1 01/10/2017   HGB 8.5 (L) 01/10/2017   HCT 26.5 (L) 01/10/2017   MCV 89.8 01/10/2017   PLT 128 (L) 01/10/2017    IMAGING: TCD velocities reviewed, increased bilateral MCA velocities.  IMPRESSION: - 41 y.o. 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 Remains neurologically stable, has elevated MCA velocities  PLAN: - Cont close monitoring of neurologic exam. Will keep hydrated but with stable exam would not add a pressor for hyperdynamic treatment at this time - Mobilize as tolerated

## 2017-01-12 ENCOUNTER — Encounter (HOSPITAL_COMMUNITY): Payer: Self-pay | Admitting: *Deleted

## 2017-01-12 ENCOUNTER — Inpatient Hospital Stay (HOSPITAL_COMMUNITY): Payer: BC Managed Care – PPO

## 2017-01-12 DIAGNOSIS — I671 Cerebral aneurysm, nonruptured: Secondary | ICD-10-CM

## 2017-01-12 NOTE — Progress Notes (Signed)
Pt seen and examined. No issues overnight. HA a little better today.  EXAM: Temp:  [98 F (36.7 C)-98.9 F (37.2 C)] 98.7 F (37.1 C) (03/28 0800) Pulse Rate:  [62-83] 65 (03/28 1100) Resp:  [13-29] 21 (03/28 1100) BP: (104-125)/(47-72) 114/72 (03/28 1100) SpO2:  [91 %-100 %] 97 % (03/28 1100) Intake/Output      03/27 0701 - 03/28 0700 03/28 0701 - 03/29 0700   P.O.  100   I.V. (mL/kg) 3600 (59) 600 (9.8)   IV Piggyback 310 200   Total Intake(mL/kg) 3910 (64.1) 900 (14.8)   Urine (mL/kg/hr) 8425 (5.8) 1100 (3.2)   Stool 0 (0)    Total Output 8425 1100   Net -4515 -200        Stool Occurrence 1 x     Awake, alert, oriented Speech fluent EOMI Good strength, no drift  LABS: Lab Results  Component Value Date   CREATININE 0.53 01/10/2017   BUN <5 (L) 01/10/2017   NA 137 01/10/2017   K 4.0 01/10/2017   CL 106 01/10/2017   CO2 25 01/10/2017   Lab Results  Component Value Date   WBC 7.1 01/10/2017   HGB 8.5 (L) 01/10/2017   HCT 26.5 (L) 01/10/2017   MCV 89.8 01/10/2017   PLT 128 (L) 01/10/2017    IMPRESSION: - 41 y.o. female SAH d# 6 s/p left Pcom clipping, neuro stable  PLAN: - Cont supportive care, mobilize as tolerated

## 2017-01-12 NOTE — Progress Notes (Signed)
qPhysical Therapy Treatment Patient Details Name: Bonnie FantasiaDawn Tran MRN: [id number] DOB: 1976/04/07 Today's Date: 01/12/2017    History of Present Illness pt presents post L PCA Aneurysm and SAH, now s/p L Pterional Crani and Coiling.  pt intubated 3/22 - 3/24.  No PMH.      PT Comments    Pt progressing well toward goals with less physical assist required for mobility, however, still limited by inability to keep eyes open during ambulation due to photophobia despite use of sunglasses and dimming of lights in hallway.  Pt educated on techniques for bed mobility to reduce strain on back. Current d/c recommendation remains appropriate when medically ready. PT will continue to follow.   Follow Up Recommendations  Outpatient PT;Supervision/Assistance - 24 hour     Equipment Recommendations  None recommended by PT    Recommendations for Other Services       Precautions / Restrictions Precautions Precautions: Fall Precaution Comments: photophobia-requires max v/c for keeping eyes open Restrictions Weight Bearing Restrictions: No    Mobility  Bed Mobility Overal bed mobility: Needs Assistance Bed Mobility: Sidelying to Sit;Sit to Supine   Sidelying to sit: Supervision   Sit to supine: Supervision   General bed mobility comments: supervision for safety. educated on sidelying technique to reduce strain on back with bed mobility. increased time and effort  Transfers Overall transfer level: Needs assistance Equipment used: None Transfers: Sit to/from Stand Sit to Stand: Supervision         General transfer comment: supervision for safety. increased time and effort. cues for eye opening. stood x 2 with transfer to toilet in bathroom  Ambulation/Gait Ambulation/Gait assistance: Min assist Ambulation Distance (Feet): 150 Feet Assistive device: None Gait Pattern/deviations: Step-through pattern;Decreased stride length   Gait velocity interpretation: Below normal speed for  age/gender General Gait Details: verbal cues to keep eyes open without compliance. pt required physical assist to navigate hallways due to inability to keep eyes open despite use of sunglasses and dimming of lights in hallway   Stairs            Wheelchair Mobility    Modified Rankin (Stroke Patients Only) Modified Rankin (Stroke Patients Only) Pre-Morbid Rankin Score: No symptoms Modified Rankin: Moderately severe disability     Balance Overall balance assessment: Needs assistance Sitting-balance support: Feet supported;No upper extremity supported Sitting balance-Leahy Scale: Good     Standing balance support: No upper extremity supported;During functional activity Standing balance-Leahy Scale: Good                              Cognition Arousal/Alertness: Awake/alert Behavior During Therapy: WFL for tasks assessed/performed Overall Cognitive Status: Within Functional Limits for tasks assessed                                 General Comments: moans and grimaces because of back, but states she does not have pain in back      Exercises      General Comments General comments (skin integrity, edema, etc.): transfer to toilet in bathroom. pt independent with toilet hygeine      Pertinent Vitals/Pain Pain Assessment: Faces Faces Pain Scale: Hurts even more Pain Location: does not specify  Pain Descriptors / Indicators: Grimacing;Moaning Pain Intervention(s): Limited activity within patient's tolerance;Monitored during session    Home Living  Prior Function            PT Goals (current goals can now be found in the care plan section) Progress towards PT goals: Progressing toward goals    Frequency    Min 4X/week      PT Plan Current plan remains appropriate    Co-evaluation             End of Session Equipment Utilized During Treatment: Gait belt (sunglasses) Activity Tolerance: Patient  tolerated treatment well Patient left: in bed;with call bell/phone within reach;with bed alarm set;with family/visitor present Nurse Communication: Mobility status PT Visit Diagnosis: Difficulty in walking, not elsewhere classified (R26.2)     Time: 1610-9604 PT Time Calculation (min) (ACUTE ONLY): 19 min  Charges:  $Gait Training: 8-22 mins                    G Codes:       [employer], SPT Acute Rehab SPT [phone]     Lane Hacker 01/12/2017, 3:00 PM

## 2017-01-12 NOTE — Progress Notes (Signed)
Rehab admissions - Noted PT recommending outpatient therapies.  Patient is doing well.  Will not need an inpatient rehab consult or admission.  #[phone]

## 2017-01-12 NOTE — Progress Notes (Addendum)
Transcranial Doppler  Date POD PCO2 HCT BP  MCA ACA PCA OPHT SIPH VERT Basilar  01/10/17 MS     Right  Left   159  148   -86  -101   *  43   24  9   *  *   -38  -44   *  *    3/28/18rds     Right  Left   162  125   -90  50   26  *   *  *   *  *   *  *   *  *         Right  Left                                             Right  Left                                             Right  Left                                            Right  Left                                            Right  Left                                        MCA = Middle Cerebral Artery      OPHT = Opthalmic Artery     BASILAR = Basilar Artery   ACA = Anterior Cerebral Artery     SIPH = Carotid Siphon PCA = Posterior Cerebral Artery   VERT = Verterbral Artery                   Normal MCA = 62+\-12 ACA = 50+\-12 PCA = 42+\-23   01/10/17- Right Lindegaard ratio= 5.7    Left Lindegaard ratio= 4.0 01/10/17- Preliminary results discussed with Dr. Conchita ParisNundkumar and Toma CopierBethany, RN. 01/12/2017 11:44 AM Gertie FeyMichelle Simonetti, BS, RVT, RDCS, RDMS  01/12/17  -  (*) not able to insonate due to machine malfunction. Right Lindegaard ratio =4.9 - Left Lindegaard ratio = 4.3, RDS

## 2017-01-12 NOTE — Progress Notes (Signed)
OT Cancellation Note  Patient Details Name: Bonnie FantasiaDawn Tran MRN: [id number] DOB: 1975-11-04   Cancelled Treatment:    Reason Eval/Treat Not Completed: Pain limiting ability to participate.  Pt reports severe HA, and unable to participate.  Will continue attempts.  Haliey Romberg [place]arpe, OTR/L [phone]   Jeani HawkingConarpe, [name] 01/12/2017, 5:22 PM

## 2017-01-12 NOTE — Progress Notes (Signed)
  Speech Language Pathology Treatment: Dysphagia  Patient Details Name: Bonnie Tran Tran MRN: [id number] DOB: [birth year] Today's Date: 01/12/2017 Time: [id number] SLP Time Calculation (min) (ACUTE ONLY): 18 min  Assessment / Plan / Recommendation Clinical Impression  Pt consumed soft solids without reduced mandibular opening observed and mildly prolonged mastication, but still with adequate oral preparation and clearance. She is eager to eat more solid food options (requesting bread and pizza). Recommend advancement to regular diet and thin liquids with continued recommendation for small bites. Will continue to follow for cognitive goals and briefly for tolerance of advanced diet. Of note, pt also experiencing some questionable auditory hallucinations this session - RN made aware.   HPI HPI: Bonnie Tran Bonnie Tran a 41 y.o.femalewho presented 01/06/17 with sudden onset, severe, throbbing HAwith associated nausea.  Per pt, she was doing shoulder presses at the gym, when she suddenly began to experience an intense throbbing pain in her posterior neck/head. 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 She is s/p left pterional craniotomy, clipping of posterior communicating artery aneurysm. She was intubated 01/06/17 to 01/08/17. Referred for bedside swallowing evaluation, cognitive-linguistic evaluation.      SLP Plan  Continue with current plan of care       Recommendations  Diet recommendations: Regular;Thin liquid Liquids provided via: Cup;Straw Medication Administration: Whole meds with liquid Supervision: Patient able to self feed Compensations: Slow rate;Small sips/bites Postural Changes and/or Swallow Maneuvers: Seated upright 90 degrees                Oral Care Recommendations: Oral care BID Follow up Recommendations: Inpatient Rehab SLP Visit Diagnosis: Dysphagia, oral phase (R13.11) Plan: Continue with current plan of care       GO                [name],  Alaiyah Bollman 01/12/2017, 11:36 AM  Maxcine HamLaura Paiewonsky, M.A. CCC-SLP [phone]

## 2017-01-13 MED ORDER — WHITE PETROLATUM GEL
Status: AC
  Administered 2017-01-13: 11:00:00
  Filled 2017-01-13: qty 1

## 2017-01-13 MED ORDER — DIAZEPAM 5 MG PO TABS
ORAL_TABLET | ORAL | Status: AC
  Filled 2017-01-13: qty 1

## 2017-01-13 MED ORDER — DIAZEPAM 5 MG PO TABS
5.0000 mg | ORAL_TABLET | Freq: Four times a day (QID) | ORAL | Status: DC | PRN
  Administered 2017-01-13 – 2017-01-21 (×9): 5 mg via ORAL
  Filled 2017-01-13 (×8): qty 1

## 2017-01-13 MED ORDER — LIDOCAINE 5 % EX PTCH
1.0000 | MEDICATED_PATCH | CUTANEOUS | Status: DC
  Administered 2017-01-13: 1 via TRANSDERMAL
  Filled 2017-01-13: qty 1

## 2017-01-13 MED ORDER — FAMOTIDINE 20 MG PO TABS
20.0000 mg | ORAL_TABLET | Freq: Two times a day (BID) | ORAL | Status: DC
  Administered 2017-01-13 – 2017-01-22 (×19): 20 mg via ORAL
  Filled 2017-01-13 (×19): qty 1

## 2017-01-13 NOTE — Evaluation (Signed)
Occupational Therapy Evaluation Patient Details Name: Bonnie Tran MRN: [id number] DOB: 04-Oct-1976 Today's Date: 01/13/2017    History of Present Illness pt presents post L PCA Aneurysm and SAH, now s/p L Pterional Crani and Coiling.  pt intubated 3/22 - 3/24.  No PMH.     Clinical Impression   Pt admitted with above. She demonstrates the below listed deficits and will benefit from continued OT to maximize safety and independence with BADLs.  Pt brighter today with c/o pain 4/10, but she is very fearful and anxious that pain may return thus she moves in a very guarded manner.   She demonstrates generalized weakness, decreased activity tolerance.  She endorses blurry vision, which is new - did not assess vision this date to prevent exacerbating pain.  She also demonstrates photophobia and wears sunglasses.   Cognition appears [facility] for basic ADLs and basic tasks, but will need further assessment      Follow Up Recommendations  Outpatient OT;Supervision/Assistance - 24 hour    Equipment Recommendations  None recommended by OT    Recommendations for Other Services       Precautions / Restrictions Precautions Precautions: Fall Precaution Comments: photophobia-requires max v/c for keeping eyes open      Mobility Bed Mobility Overal bed mobility: Needs Assistance Bed Mobility: Sidelying to Sit;Sit to Supine   Sidelying to sit: Min assist   Sit to supine: Supervision   General bed mobility comments: Pt guarded when moving to sitting due to fear of pain, and pulls on therapist's hand to prevent stress movement of head/neck   Transfers Overall transfer level: Needs assistance Equipment used: None Transfers: Sit to/from [employer] Transfers Sit to Stand: Supervision Stand pivot transfers: Supervision            Balance Overall balance assessment: Needs assistance Sitting-balance support: Feet supported;No upper extremity supported Sitting balance-Leahy Scale: Good      Standing balance support: No upper extremity supported;During functional activity Standing balance-Leahy Scale: Good                             ADL either performed or assessed with clinical judgement   ADL Overall ADL's : Needs assistance/impaired Eating/Feeding: Set up;Bed level   Grooming: Wash/dry hands;Wash/dry face;Oral care;Min guard;Standing;Set up   Upper Body Bathing: Minimal assistance;Sitting   Lower Body Bathing: Minimal assistance;Sit to/from stand   Upper Body Dressing : Minimal assistance;Sitting   Lower Body Dressing: Minimal assistance;Sit to/from stand   Toilet Transfer: Min guard;Ambulation;Comfort height toilet;Grab bars   Toileting- Clothing Manipulation and Hygiene: Min guard;Sit to/from stand       Functional mobility during ADLs: Min guard General ADL Comments: Pt moves slowly and very guarded due to fear of pain      Vision   Additional Comments: Pt reports photophobia.  She has sunglasses which reduce glare and irritation.  She reports blurriness that is new.  vision not tested this date due to pt with manageable pain level and at this time, don't want to exacerbate her pain      Perception Perception Perception Tested?: Yes Comments: Grossly assessed    Praxis Praxis Praxis tested?: Within functional limits    Pertinent Vitals/Pain Pain Assessment: 0-10 Pain Score: 4  Pain Descriptors / Indicators: Aching Pain Intervention(s): Monitored during session;Heat applied     Hand Dominance Right   Extremity/Trunk Assessment Upper Extremity Assessment Upper Extremity Assessment: Overall WFL for tasks assessed   Lower Extremity  Assessment Lower Extremity Assessment: Defer to PT evaluation   Cervical / Trunk Assessment Cervical / Trunk Assessment: Normal   Communication Communication Communication: No difficulties   Cognition Arousal/Alertness: Awake/alert Behavior During Therapy: WFL for tasks assessed/performed Overall  Cognitive Status: Difficult to assess                                 General Comments: Pt is very guarded due to fear of increasing pain.  She appears [facility] for basic skills/info, but will need to be assessed with higher level activities.  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         OT Problem List: Decreased activity tolerance;Impaired vision/perception;Decreased cognition;Decreased knowledge of use of DME or AE;Pain      OT Treatment/Interventions: Self-care/ADL training;DME and/or AE instruction;Therapeutic activities;Cognitive remediation/compensation;Visual/perceptual remediation/compensation;Patient/family education    OT Goals(Current goals can be found in the care plan section) Acute Rehab OT Goals Patient Stated Goal: back to normal OT Goal Formulation: With patient Time For Goal Achievement: 01/27/17 Potential to Achieve Goals: Good ADL Goals Pt Will Perform Eating: Independently;sitting Pt Will Perform Grooming: with modified independence;standing Pt Will Perform Upper Body Bathing: with modified independence;sitting Pt Will Perform Lower Body Bathing: with modified independence;sit to/from stand Pt Will Perform Upper Body Dressing: with modified independence;sitting Pt Will Perform Lower Body Dressing: with  modified independence;sit to/from stand Pt Will Transfer to Toilet: with modified independence;ambulating;regular height toilet;bedside commode;grab bars Pt Will Perform Toileting - Clothing Manipulation and hygiene: with modified independence;sit to/from stand Pt Will Perform Tub/Shower Transfer: Shower transfer;Tub transfer;with modified independence;ambulating Additional ADL Goal #1: Pt will divide attention with min cues during ADL tasks Additional ADL Goal #2: Pt will participate in further visual assessment   OT Frequency: Min 3X/week   Barriers to D/C:            Co-evaluation              End of Session Nurse Communication: Mobility status  Activity Tolerance: Patient tolerated treatment well Patient left: in bed;with call bell/phone within reach  OT Visit Diagnosis: Cognitive communication deficit (R41.841);Pain Symptoms and signs involving cognitive functions: Nontraumatic SAH Pain - part of body:  (head )                Time: [id number] OT Time Calculation (min): 23 min Charges:  OT General Charges $OT Visit: 1 Procedure OT Evaluation $OT Eval Moderate Complexity: 1 Procedure OT Treatments $Self Care/Home Management : 8-22 mins G-Codes:     [employer] AmericanWendi Cillian Gwinner, OTR/L [phone]   Jeani HawkingConarpe, Abdullah Rizzi M 01/13/2017, 12:17 PM

## 2017-01-13 NOTE — Progress Notes (Signed)
  Speech Language Pathology Treatment: Dysphagia;Cognitive-Linquistic  Patient Details Name: Bonnie FantasiaDawn [place] MRN: [id number] DOB: [birth year] Today's Date: 01/13/2017 Time: [id number] SLP Time Calculation (min) (ACUTE ONLY): 16 min  Assessment / Plan / Recommendation Clinical Impression  SLP provided skilled observation during breakfast meal with regular textures and thin liquids. Pt has increased mandibular ROM today, and she believes it is closer to her baseline. Mastication was efficient. Recommend to continue with current diet - no further dysphagia intervention needed. Cognitively she needs Min cues for recall of daily events and new information. Min A provided for basic problem solving as well, but overall she appears more alert and interactive today. Will continue to follow for cognitive treatment.   HPI HPI: Bonnie DawsonDawn Davisis a 41 y.o.femalewho presented 01/06/17 with sudden onset, severe, throbbing HAwith associated nausea.  Per pt, she was doing shoulder presses at the gym, when she suddenly began to experience an intense throbbing pain in her posterior neck/head. 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 She is s/p left pterional craniotomy, clipping of posterior communicating artery aneurysm. She was intubated 01/06/17 to 01/08/17. Referred for bedside swallowing evaluation, cognitive-linguistic evaluation.      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 CCC-SLP [phone]

## 2017-01-13 NOTE — Progress Notes (Signed)
Pt seen and examined. No issues overnight. Had low back soreness earlier, improved now.  EXAM: Temp:  [98.3 F (36.8 C)-100.3 F (37.9 C)] 98.6 F (37 C) (03/29 1200) Pulse Rate:  [56-87] 71 (03/29 1500) Resp:  [14-36] 28 (03/29 1500) BP: (103-157)/(51-115) 112/90 (03/29 1500) SpO2:  [95 %-100 %] 100 % (03/29 1500) Intake/Output      03/29 0730 - 03/30 0729   P.O. 660   I.V. (mL/kg) 1200 (19.7)   IV Piggyback 105   Total Intake(mL/kg) 1965 (32.2)   Urine (mL/kg/hr) 2625 (4.5)   Stool    Total Output 2625   Net -660        Awake, alert, oriented Speech fluent CN intact Good strength  LABS: Lab Results  Component Value Date   CREATININE 0.53 01/10/2017   BUN <5 (L) 01/10/2017   NA 137 01/10/2017   K 4.0 01/10/2017   CL 106 01/10/2017   CO2 25 01/10/2017   Lab Results  Component Value Date   WBC 7.1 01/10/2017   HGB 8.5 (L) 01/10/2017   HCT 26.5 (L) 01/10/2017   MCV 89.8 01/10/2017   PLT 128 (L) 01/10/2017     IMPRESSION: - 41 y.o. female SAH d# 7 s/p left Pcom clipping, doing well  PLAN: - Cont supportive care

## 2017-01-13 NOTE — Progress Notes (Signed)
Pt c/o excrutiating pain in lower back, described as spasm, shooting, sharp.  Pt crying.  Attempted to give all prn meds that are available without any effectiveness.  Notified Dr. Franky Machoabbell who ordered Lidocaine patch and valium 5mg  po.  Will administer and continue to monitor.

## 2017-01-14 ENCOUNTER — Inpatient Hospital Stay (HOSPITAL_COMMUNITY): Payer: BC Managed Care – PPO

## 2017-01-14 DIAGNOSIS — I671 Cerebral aneurysm, nonruptured: Secondary | ICD-10-CM

## 2017-01-14 MED ORDER — OXYCODONE-ACETAMINOPHEN 5-325 MG PO TABS
1.0000 | ORAL_TABLET | ORAL | Status: DC | PRN
  Administered 2017-01-14 – 2017-01-15 (×5): 2 via ORAL
  Administered 2017-01-15: 1 via ORAL
  Administered 2017-01-16 – 2017-01-22 (×16): 2 via ORAL
  Filled 2017-01-14 (×8): qty 2
  Filled 2017-01-14: qty 1
  Filled 2017-01-14 (×15): qty 2

## 2017-01-14 MED ORDER — OXYCODONE HCL 5 MG PO TABS
5.0000 mg | ORAL_TABLET | ORAL | Status: DC | PRN
  Administered 2017-01-15 – 2017-01-21 (×4): 5 mg via ORAL
  Filled 2017-01-14 (×5): qty 1

## 2017-01-14 MED ORDER — DEXAMETHASONE 4 MG PO TABS
4.0000 mg | ORAL_TABLET | Freq: Three times a day (TID) | ORAL | Status: DC
  Administered 2017-01-14 – 2017-01-15 (×3): 4 mg via ORAL
  Filled 2017-01-14 (×3): qty 1

## 2017-01-14 MED ORDER — LIDOCAINE 5 % EX PTCH
1.0000 | MEDICATED_PATCH | CUTANEOUS | Status: DC
  Administered 2017-01-14 – 2017-01-22 (×9): 1 via TRANSDERMAL
  Filled 2017-01-14 (×10): qty 1

## 2017-01-14 NOTE — Progress Notes (Signed)
Transcranial Doppler  Date POD PCO2 HCT BP  MCA ACA PCA OPHT SIPH VERT Basilar  01/10/17 MS     Right  Left   159  148   -86  -101   *  43   24  9   *  *   -38  -44   *  *    3/28/18rds     Right  Left   162  125   -90  50   26  *   *  *   *  *   *  *   *  *    01/14/17 MS     Right  Left   141  99   -73  -90   68  51   *  14   *  *   -28  -17   *            Right  Left                                             Right  Left                                            Right  Left                                            Right  Left                                        MCA = Middle Cerebral Artery      OPHT = Opthalmic Artery     BASILAR = Basilar Artery   ACA = Anterior Cerebral Artery     SIPH = Carotid Siphon PCA = Posterior Cerebral Artery   VERT = Verterbral Artery                   Normal MCA = 62+\-12 ACA = 50+\-12 PCA = 42+\-23   01/10/17- Right Lindegaard ratio= 5.7    Left Lindegaard ratio= 4.0 01/10/17- Preliminary results discussed with Dr. Conchita Paris and Toma Copier, RN. 01/12/17  -  (*) not able to insonate due to machine malfunction. Right Lindegaard ratio =4.9 - Left Lindegaard ratio = 4.3, RDS 01/14/17- *Unable to insonate. Right Lindegaard ratio=4.6, Left Lindegaard ratio=2.7.   01/14/2017 5:25 PM Gertie Fey, BS, RVT, RDCS, RDMS

## 2017-01-14 NOTE — Progress Notes (Signed)
Occupational Therapy Treatment Patient Details Name: Bonnie Tran MRN: [id number] DOB: [birth year] Today's Date: 01/14/2017    History of present illness pt presents post L PCA Aneurysm and SAH, now s/p L Pterional Crani and Coiling.  pt intubated 3/22 - 3/24.  No PMH.     OT comments  Pt with improving activity tolerance, but continues with photophobia - brightness of lights correlates directly to back pain and spasms.  Needs continued cognitive and visual assessment as she is able to tolerate.    Follow Up Recommendations  Outpatient OT;Supervision/Assistance - 24 hour    Equipment Recommendations  None recommended by OT    Recommendations for Other Services      Precautions / Restrictions Precautions Precautions: Fall Precaution Comments: photophobia-requires max v/c for keeping eyes open       Mobility Bed Mobility Overal bed mobility: Needs Assistance         Sit to supine: Supervision   General bed mobility comments: Pt guarded when moving   Transfers Overall transfer level: Needs assistance Equipment used: None Transfers: Sit to/from Stand;Stand Pivot Transfers Sit to Stand: Supervision Stand pivot transfers: Supervision            Balance     Sitting balance-Leahy Scale: Good       Standing balance-Leahy Scale: Good                             ADL either performed or assessed with clinical judgement   ADL Overall ADL's : Needs assistance/impaired                         Toilet Transfer: Min guard;Ambulation;Comfort height toilet;Grab bars           Functional mobility during ADLs: Min guard       Vision   Additional Comments: Pt continues with photophobia.  She develops back pain that correlates with bright lights.  When ambulating on unit, pt develeoped increased back pain in same area on unit x 3 where lights were brighter, and it subsided later in walk when lighting changed.  She reports blurry vision.  When  looking outside, with sunglasses on, she demonstrates difficulty identifying structures.  She attempted to remove glasses, but had sudden increase of severe back pain which subsided when glasses replaced.   Discussed attempting to gradually increase lighting in room tomorrow if pain stays manageable.    Perception     Praxis      Cognition Arousal/Alertness: Awake/alert Behavior During Therapy: WFL for tasks assessed/performed Overall Cognitive Status: Within Functional Limits for tasks assessed                                 General Comments: need further assessment as she is able to tolerate it         Exercises     Shoulder Instructions       General Comments      Pertinent Vitals/ Pain       Pain Assessment: Faces Faces Pain Scale: Hurts little more Pain Location: back  Pain Descriptors / Indicators: Aching;Spasm Pain Intervention(s): Monitored during session  Home Living  Prior Functioning/Environment              Frequency  Min 3X/week        Progress Toward Goals  OT Goals(current goals can now be found in the care plan section)  Progress towards OT goals: Progressing toward goals     Plan Discharge plan remains appropriate    Co-evaluation                 End of Session    OT Visit Diagnosis: Cognitive communication 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

## 2017-01-14 NOTE — Progress Notes (Signed)
PT Cancellation Note  Patient Details Name: Bonnie Tran MRN: [id number] DOB: 1976/09/24   Cancelled Treatment:    Reason Eval/Treat Not Completed: Pain limiting ability to participate.  Pt very painful despite RN medicating.  Will f/u as appropriate.     Alison Murray Chelsy Parrales 01/14/2017, 8:56 AM

## 2017-01-14 NOTE — Progress Notes (Signed)
Subjective: Patient reports sore in back  Objective: Vital signs in last 24 hours: Temp:  [98.6 F (37 C)-99.4 F (37.4 C)] 99.1 F (37.3 C) (03/30 0800) Pulse Rate:  [56-89] 66 (03/30 0800) Resp:  [0-36] 18 (03/30 0800) BP: (95-157)/(57-115) 118/78 (03/30 0800) SpO2:  [93 %-100 %] 99 % (03/30 0800)  Intake/Output from previous day: 03/29 0701 - 03/30 0700 In: 5150 [P.O.:1340; I.V.:3600; IV Piggyback:210] Out: 6975 [Urine:6975] Intake/Output this shift: Total I/O In: 315 [P.O.:60; I.V.:150; IV Piggyback:105] Out: -   Physical Exam: Sleepy, but awakens.  Has been following commands briskly and answering questions appropriately.  Wound CDI.  Lab Results: No results for input(s): WBC, HGB, HCT, PLT in the last 72 hours. BMET No results for input(s): NA, K, CL, CO2, GLUCOSE, BUN, CREATININE, CALCIUM in the last 72 hours.  Studies/Results: No results found.  Assessment/Plan: Will start some oxycodone and decadron for unrelenting back pain.  Continue IVF and support.    LOS: 7 days    Dorian Heckle, MD 01/14/2017, 9:59 AM

## 2017-01-15 MED ORDER — DEXAMETHASONE 4 MG PO TABS: 4.0000 mg | ORAL_TABLET | Freq: Three times a day (TID) | ORAL | Status: DC

## 2017-01-15 MED ORDER — DEXAMETHASONE 4 MG PO TABS
4.0000 mg | ORAL_TABLET | Freq: Two times a day (BID) | ORAL | Status: DC
  Administered 2017-01-16 – 2017-01-18 (×5): 4 mg via ORAL
  Filled 2017-01-15 (×5): qty 1

## 2017-01-15 MED ORDER — DEXAMETHASONE 4 MG PO TABS
4.0000 mg | ORAL_TABLET | Freq: Three times a day (TID) | ORAL | Status: AC
  Administered 2017-01-15 (×2): 4 mg via ORAL
  Filled 2017-01-15 (×2): qty 1

## 2017-01-15 NOTE — Progress Notes (Signed)
Subjective: Patient reports feeling much better today after steroids.  Needing less pain medication, minimal back pain and appetite has improved.  Objective: Vital signs in last 24 hours: Temp:  [98 F (36.7 C)-98.8 F (37.1 C)] 98 F (36.7 C) (03/31 0400) Pulse Rate:  [63-88] 87 (03/31 0700) Resp:  [9-27] 9 (03/31 0700) BP: (89-117)/(49-78) 89/52 (03/31 0700) SpO2:  [95 %-100 %] 100 % (03/31 0700)  Intake/Output from previous day: 03/30 0701 - 03/31 0700 In: 5505 [P.O.:1800; I.V.:3600; IV Piggyback:105] Out: 6100 [Urine:6100] Intake/Output this shift: No intake/output data recorded.  Physical Exam: Awake, alert, conversant.  Photophobia and headache improved.  MAEW.    Lab Results: No results for input(s): WBC, HGB, HCT, PLT in the last 72 hours. BMET No results for input(s): NA, K, CL, CO2, GLUCOSE, BUN, CREATININE, CALCIUM in the last 72 hours.  Studies/Results: No results found.  Assessment/Plan: Doing better today.  Wean pain medication and steroids.  Mobilize, encourage po intake.  POD # 9 clipping of left P. Comm. Aneurysm.    LOS: 8 days    Dorian Heckle, MD 01/15/2017, 8:16 AM

## 2017-01-16 NOTE — Progress Notes (Signed)
Occupational Therapy Treatment Patient Details Name: Gyneth Hubka MRN: [id number] DOB: 12/04/1975 Today's Date: 01/16/2017    History of present illness pt presents post L PCA Aneurysm and SAH, now s/p L Pterional Crani and Coiling.  pt intubated 3/22 - 3/24.  No PMH.     OT comments  Pt is significantly improved.  She is able to perform ADLs at supervision level.  She appears to have potential visual accomodation deficits as well ? Mild Lt central field deficit at end range of the field.  She describes word finding/retrieval difficulties, and demonstrates deficits with high level executive functions needed for work and school - pt works full time, has 6 children at home, is enrolled in [employer] degree program full time.  Will continue to follow and recommend OPOT, and likely neuro ophthalmology referral.  Will continue to follow.   Follow Up Recommendations  Outpatient OT;Supervision/Assistance - 24 hour    Equipment Recommendations  None recommended by OT    Recommendations for Other Services      Precautions / Restrictions Precautions Precautions: None       Mobility Bed Mobility Overal bed mobility: Independent                Transfers Overall transfer level: Needs assistance   Transfers: Sit to/from Stand;Stand Pivot Transfers Sit to Stand: Independent Stand pivot transfers: Supervision            Balance Overall balance assessment: No apparent balance deficits (not formally assessed)                                         ADL either performed or assessed with clinical judgement   ADL   Eating/Feeding: Independent   Grooming: Wash/dry hands;Wash/dry face;Oral care;Brushing hair;Supervision/safety;Standing   Upper Body Bathing: Set up;Sitting   Lower Body Bathing: Supervison/ safety;Sit to/from stand   Upper Body Dressing : Set up;Sitting   Lower Body Dressing: Supervision/safety;Sit to/from stand   Toilet Transfer:  Supervision/safety;Ambulation;Regular Toilet   Toileting- Architect and Hygiene: Supervision/safety       Functional mobility during ADLs: Supervision/safety       Vision   Additional Comments: Pt continues to report blurring and frequently squints.  Occulomotor pursuits intact.  She appears to potentially have mild Lt central field deficit at the end range of the field.  Convergence is impaired.  She does wear glasses and with glasses in place, she is able to read small textbook text on phone, but visually and cognitively fatigues.  She reports glasses aren't as "crisp" as they once were and she has to work harder to keep images in focus.  Lt eye appearts mildly esotrophic, however denies diplopia or shadowing.   Perception     Praxis      Cognition Arousal/Alertness: Awake/alert Behavior During Therapy: WFL for tasks assessed/performed Overall Cognitive Status: Impaired/Different from baseline Area of Impairment: Attention                   Current Attention Level: Alternating;Divided           General Comments: Pt demonstrates difficulty with alternating and dividing attention especially when fatigued.  She demonstrates excellent problem solving skills and is able to self initiate compensatory strategies for deficits, but fatigues and has difficulty continuing with task.  She describes word finding difficulties and difficulty recognizing written words.  She has been reading very  small print for college coursework, but only able to read 2-3 paragraphs at time and has to frequently re-read info to comprehend it.  She able to retain info she read last night and was able to quickly scroll through pages to locate where she discontinued the task .   Much of her deficit area appears to be language based.  Will continue to assess executive functions as they relate to ADL performance         Exercises     Shoulder Instructions       General Comments Long  discussion with pt re: deficits, need for rest breaks, healing process, her deficits, etc.  Pt frustrated and fearful at times by her deficits and is nervous about her ability to continue with school while juggling parenthood and working (pt works as an Print production planner, she has 6 children ages 32-19.  The oldest child has cognitive physical deficits.  she is a single mom, and is enrolled in college full time.  She was in [employer] for 10 years and previously was a Fish farm manager    Pertinent Vitals/ Pain       Pain Assessment: 0-10 Pain Score: 3  Pain Location: headache  Pain Descriptors / Indicators: Aching Pain Intervention(s): Monitored during session  Home Living                                          Prior Functioning/Environment              Frequency  Min 3X/week        Progress Toward Goals  OT Goals(current goals can now be found in the care plan section)  Progress towards OT goals: Progressing toward goals     Plan Discharge plan remains appropriate    Co-evaluation                 End of Session    OT Visit Diagnosis: 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

## 2017-01-16 NOTE — Progress Notes (Signed)
Pt seen and examined. No issues overnight.  EXAM: Temp:  [98.3 F (36.8 C)-99.1 F (37.3 C)] 98.5 F (36.9 C) (04/01 0811) Pulse Rate:  [68-95] 73 (04/01 0600) Resp:  [8-27] 23 (04/01 0600) BP: (100-121)/(55-86) 100/58 (04/01 0600) SpO2:  [96 %-100 %] 99 % (04/01 0600) Intake/Output      03/31 0701 - 04/01 0700 04/01 0701 - 04/02 0700   P.O. 1200    I.V. (mL/kg) 3450 (56.6)    IV Piggyback 210    Total Intake(mL/kg) 4860 (79.7)    Urine (mL/kg/hr) 6225 (4.3) 700 (4.3)   Stool 0 (0)    Total Output 6225 700   Net -1365 -700        Stool Occurrence 3 x     Awake and alert Follows commands throughout Full strength, no drift Incision c/d/i  Stable Continue current care

## 2017-01-17 ENCOUNTER — Inpatient Hospital Stay (HOSPITAL_COMMUNITY): Payer: BC Managed Care – PPO

## 2017-01-17 DIAGNOSIS — I671 Cerebral aneurysm, nonruptured: Secondary | ICD-10-CM

## 2017-01-17 NOTE — Progress Notes (Addendum)
qPhysical Therapy Treatment Patient Details Name: Bonnie Tran MRN: [id number] DOB: 04-02-1976 Today's Date: 01/17/2017    History of Present Illness pt presents post L PCA Aneurysm and SAH, now s/p L Pterional Crani and Coiling.  pt intubated 3/22 - 3/24.  No PMH.      PT Comments    Pt up in bathroom on arrival by herself, bare feet, and lines stretched to just barely reach toilet.  Pt ed on safety and need for A, with pt admitting that nursing had told her this, but pt with poor awareness of safety and deficits, so unable to follow-through on calling for A.  Pt does demonstrate good balance, but difficulty managing lines and admits she feels "special" since she knows she is having cognitive deficits.  Pt then pulled out her bills and wanted to try to pay her bills, but admitted that they didn't make sense to her.  Advised pt to get A with money management at this time and to call for A with mobility.  Pt placed on a chair alarm.    Follow Up Recommendations  Outpatient PT;Supervision/Assistance - 24 hour     Equipment Recommendations  None recommended by PT    Recommendations for Other Services       Precautions / Restrictions Precautions Precautions: None Precaution Comments: Photophobia, but improving. Restrictions Weight Bearing Restrictions: No    Mobility  Bed Mobility               General bed mobility comments: pt up in bathroom on arrival  Transfers Overall transfer level: Needs assistance Equipment used: None Transfers: Sit to/from Stand Sit to Stand: Supervision         General transfer comment: pt moves well, but has poor awareness of lines and safety.    Ambulation/Gait Ambulation/Gait assistance: Supervision Ambulation Distance (Feet): 20 Feet (total in room) Assistive device: None Gait Pattern/deviations: Step-through pattern     General Gait Details: Kept mobility in room working on multi tasking and problem solving as opposed to hallway  ambulation.   Stairs            Wheelchair Mobility    Modified Rankin (Stroke Patients Only)       Balance Overall balance assessment: No apparent balance deficits (not formally assessed)                                          Cognition Arousal/Alertness: Awake/alert Behavior During Therapy: WFL for tasks assessed/performed Overall Cognitive Status: Impaired/Different from baseline Area of Impairment: Attention;Memory;Safety/judgement;Problem solving                   Current Attention Level: Alternating Memory: Decreased short-term memory;Decreased recall of precautions   Safety/Judgement: Decreased awareness of safety;Decreased awareness of deficits   Problem Solving: Slow processing;Difficulty sequencing;Requires verbal cues General Comments: pt found ambulating out of bathroom by herself and when asked about this indicates she just thought she would try by herself.  pt pulled her bills out of her bag and stated she wanted to try to pay some bills, but that she couldn't tell how to pay them/  pt ed on need for A with bill management and re-orienting to deficits since aneurysm and surgery.        Exercises      General Comments General comments (skin integrity, edema, etc.): pt ed on current cognitive deficits and  safety issues at this point.  pt had gotten herself OOB and stretched her cords so that she could barely reach the bathroom.  When questioned pt acknowledges that the nurse had told her to call for A, however she has poor memory and poor awareness of deficits.  pt then trying to look over some bills and admitting that she didn't understand them.  Encouraged pt to get A for money management at this time.        Pertinent Vitals/Pain Pain Assessment: 0-10 Pain Score: 3  Pain Location: Low Back Pain Descriptors / Indicators: Aching Pain Intervention(s): Monitored during session;Premedicated before session;Repositioned    Home  Living                      Prior Function            PT Goals (current goals can now be found in the care plan section) Acute Rehab PT Goals Patient Stated Goal: back to normal PT Goal Formulation: With patient Time For Goal Achievement: 01/24/17 Potential to Achieve Goals: Good Progress towards PT goals: Progressing toward goals    Frequency    Min 4X/week      PT Plan Current plan remains appropriate    Co-evaluation             End of Session   Activity Tolerance: Patient tolerated treatment well Patient left: in chair;with call bell/phone within reach;with chair alarm set Nurse Communication: Mobility status PT Visit Diagnosis: Difficulty in walking, not elsewhere classified (R26.2)     Time: 0981-1914 PT Time Calculation (min) (ACUTE ONLY): 17 min  Charges:  $Therapeutic Activity: 8-22 mins                    G CodesSunny Schlein, [place] [phone] 01/17/2017, 2:17 PM

## 2017-01-17 NOTE — Progress Notes (Signed)
Transcranial Doppler  Date POD PCO2 HCT BP  MCA ACA PCA OPHT SIPH VERT Basilar  01/10/17 MS     Right  Left   159  148   -86  -101   *  43   24  9   *  *   -38  -44   *  *    3/28/18rds     Right  Left   162  125   -90  50   26  *   *  *   *  *   *  *   *  *    01/14/17 MS     Right  Left   141  99   -73  -90   68  51   *  14   *  *   -28  -17   *      01/17/17 rds      Right  Left   106  122   -30  -48   30  15   36  30   61  54   -64  -44   -72            Right  Left                                            Right  Left                                            Right  Left                                        MCA = Middle Cerebral Artery      OPHT = Opthalmic Artery     BASILAR = Basilar Artery   ACA = Anterior Cerebral Artery     SIPH = Carotid Siphon PCA = Posterior Cerebral Artery   VERT = Verterbral Artery                   Normal MCA = 62+\-12 ACA = 50+\-12 PCA = 42+\-23   01/10/17- Right Lindegaard ratio= 5.7    Left Lindegaard ratio= 4.0 01/10/17- Preliminary results discussed with Dr. Conchita Paris and Toma Copier, RN. 01/12/17  -  (*) not able to insonate due to machine malfunction. Right Lindegaard ratio =4.9 - Left Lindegaard ratio = 4.3, RDS 01/14/17- *Unable to insonate. Right Lindegaard ratio=4.6, Left Lindegaard ratio=2.7.  01/17/17 - Right Lindegaard ratio = 2.8, Left Lindegaard ratio = 3.3 Alferd Obryant, BS, RDMS, RVT

## 2017-01-17 NOTE — Progress Notes (Signed)
Subjective: Patient reports still with headache.  Back pain better.  Objective: Vital signs in last 24 hours: Temp:  [97.4 F (36.3 C)-98.7 F (37.1 C)] 98.7 F (37.1 C) (04/02 0400) Pulse Rate:  [50-90] 62 (04/02 0637) Resp:  [14-32] 21 (04/02 0637) BP: (88-127)/(51-91) 88/52 (04/02 0600) SpO2:  [94 %-100 %] 97 % (04/02 0637)  Intake/Output from previous day: 04/01 0701 - 04/02 0700 In: 2917.5 [P.O.:240; I.V.:2467.5; IV Piggyback:210] Out: 3325 [Urine:3325] Intake/Output this shift: No intake/output data recorded.  Physical Exam: Awake, alert, conversant.  MAEW.    Lab Results: No results for input(s): WBC, HGB, HCT, PLT in the last 72 hours. BMET No results for input(s): NA, K, CL, CO2, GLUCOSE, BUN, CREATININE, CALCIUM in the last 72 hours.  Studies/Results: No results found.  Assessment/Plan: Patient is doing well.  Continue low dose decadron and fluids.  TCDs today.    LOS: 10 days    Dorian Heckle, MD 01/17/2017, 8:11 AM

## 2017-01-18 MED ORDER — DEXAMETHASONE 2 MG PO TABS
2.0000 mg | ORAL_TABLET | Freq: Two times a day (BID) | ORAL | Status: DC
  Administered 2017-01-18 – 2017-01-20 (×4): 2 mg via ORAL
  Filled 2017-01-18 (×4): qty 1

## 2017-01-18 NOTE — Progress Notes (Signed)
  Speech Language Pathology Treatment: Cognitive-Linquistic  Patient Details Name: Deshon Koslowski MRN: [id number] DOB: October 17, 1976 Today's Date: 01/18/2017 Time: 4098-1191 SLP Time Calculation (min) (ACUTE ONLY): 47 min  Assessment / Plan / Recommendation Clinical Impression  Pt presents with higher level difficulties with expressive and receptive language, most evident as she tries to read and write. SLP provided Min cues to use strategies to facilitate breaking down information as she read her textbook. While taking notes she was intermittently adding, swapping, or omitting letters, which she could retrospectively identify with Mod I. Pt reported improved comprehension with strategies offered today. SLP will continue to follow acutely to facilitate transition to OP SLP.   HPI HPI: Scherry Laverne a 41 y.o.femalewho presented 01/06/17 with sudden onset, severe, throbbing HAwith associated nausea.  Per pt, she was doing shoulder presses at the gym, when she suddenly began to experience an intense throbbing pain in her posterior neck/head. 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 She is s/p left pterional craniotomy, clipping of posterior communicating artery aneurysm. She was intubated 01/06/17 to 01/08/17. Referred for bedside swallowing evaluation, cognitive-linguistic evaluation.      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 CCC-SLP [phone]

## 2017-01-18 NOTE — Progress Notes (Signed)
qPhysical Therapy Treatment Patient Details Name: Bonnie Tran MRN: [id number] DOB: May 01, 1976 Today's Date: 01/18/2017    History of Present Illness Pt is 41 y.o. female who presents to ED on 01/06/17 post L PCA aneurysm and SAH, now s/p L Pterional crani and coiling. Intubated 3/22 - 3/24.  No PMH.     PT Comments    Pt progressing well with mobility. Able to amb 500+ ft today independently; educ on current cognitive deficits, including increased distractibility and difficulty multi-tasking. Demonstrates good understanding of her cognitive deficits and good compensatory strategies. Pt has met PT goals. D/C PT.      Follow Up Recommendations  No PT follow up     Equipment Recommendations  None recommended by PT    Recommendations for Other Services       Precautions / Restrictions Precautions Precautions: None Restrictions Weight Bearing Restrictions: No    Mobility  Bed Mobility Overal bed mobility: Independent             General bed mobility comments: Pt received standing in room  Transfers Overall transfer level: Independent Equipment used: None Transfers: Sit to/from Stand Sit to Stand: Independent         General transfer comment: Pt indep with standing, with improved awareness of lines/leads.   Ambulation/Gait Ambulation/Gait assistance: Independent Ambulation Distance (Feet): 500 Feet Assistive device: None Gait Pattern/deviations: Step-through pattern Gait velocity: Decreased   General Gait Details: Able to amb off floor with supervision for balance, progressing to independent. Pushed IV pole ~300 ft   Stairs            Wheelchair Mobility    Modified Rankin (Stroke Patients Only) Modified Rankin (Stroke Patients Only) Pre-Morbid Rankin Score: No symptoms Modified Rankin: Slight disability     Balance Overall balance assessment: No apparent balance deficits (not formally assessed)         Standing balance support: No upper  extremity supported;During functional activity Standing balance-Leahy Scale: Good Standing balance comment: Pt able to change clothes in bathroom and perform ADLs at sink (brush teeth, fix hair) indep.                             Cognition Arousal/Alertness: Awake/alert Behavior During Therapy: WFL for tasks assessed/performed Overall Cognitive Status: Impaired/Different from baseline Area of Impairment: Attention                   Current Attention Level: Alternating;Divided           General Comments: Pt able to perform path finding using signage with no cues.  Requires min cues to redirect to task - she reports long h/o of ADHD, and takes Aterol, which she has not resumed       Exercises      General Comments        Pertinent Vitals/Pain Pain Assessment: 0-10 Pain Score: 2  Faces Pain Scale: Hurts little more Pain Location: Headache Pain Descriptors / Indicators: Headache Pain Intervention(s): Monitored during session    Home Living                      Prior Function            PT Goals (current goals can now be found in the care plan section) Progress towards PT goals: Goals met/education completed, patient discharged from PT    Frequency    Min 4X/week  PT Plan Discharge plan needs to be updated    Co-evaluation             End of Session Equipment Utilized During Treatment: Gait belt Activity Tolerance: Patient tolerated treatment well Patient left: Other (comment) (with OT) Nurse Communication: Mobility status PT Visit Diagnosis: Difficulty in walking, not elsewhere classified (R26.2)     Time: 1450-1516 PT Time Calculation (min) (ACUTE ONLY): 26 min  Charges:  $Gait Training: 23-37 mins                    G Codes:      Enis Gash, SPT [phone]  Mabeline Caras 01/18/2017, 4:36 PM

## 2017-01-18 NOTE — Progress Notes (Signed)
Occupational Therapy Treatment Patient Details Name: Bonnie Tran MRN: [id number] DOB: [birth year] Today's Date: 01/18/2017    History of present illness Pt is 41 y.o. female who presents to ED on 01/06/17 post L PCA aneurysm and SAH, now s/p L Pterional crani and coiling. Intubated 3/22 - 3/24.  No PMH.     OT comments  Pt is mod I with ADLs, requires min cues for higher level skills in distractible environments.   Follow Up Recommendations  Outpatient OT;Supervision/Assistance - 24 hour    Equipment Recommendations  None recommended by OT    Recommendations for Other Services      Precautions / Restrictions Precautions Precautions: None Restrictions Weight Bearing Restrictions: No       Mobility Bed Mobility Overal bed mobility: Independent             General bed mobility comments: Pt received standing in room  Transfers Overall transfer level: Independent Equipment used: None Transfers: Sit to/from Stand Sit to Stand: Independent         General transfer comment: Pt indep with standing, with improved awareness of lines/leads.     Balance Overall balance assessment: No apparent balance deficits (not formally assessed)         Standing balance support: No upper extremity supported;During functional activity Standing balance-Leahy Scale: Good Standing balance comment: Pt able to change clothes in bathroom and perform ADLs at sink (brush teeth, fix hair) indep.                            ADL either performed or assessed with clinical judgement   ADL Overall ADL's : Modified independent                                       General ADL Comments: Pt requires assist for line management only      Vision   Additional Comments: Pt able to scan environment independently to perform path finding    Perception     Praxis      Cognition Arousal/Alertness: Awake/alert Behavior During Therapy: WFL for tasks  assessed/performed Overall Cognitive Status: Impaired/Different from baseline Area of Impairment: Attention                   Current Attention Level: Alternating;Divided           General Comments: Pt able to perform path finding using signage with no cues.  Requires min cues to redirect to task - she reports long h/o of ADHD, and takes Aterol, which she has not resumed         Exercises     Shoulder Instructions       General Comments Pt educ on current cognitive deficits and safety issues at this point. Increased distractability and difficulty multi-tasking, which pt is aware of.     Pertinent Vitals/ Pain       Pain Assessment: 0-10 Pain Score: 2  Faces Pain Scale: Hurts little more Pain Location: Headache Pain Descriptors / Indicators: Headache Pain Intervention(s): Monitored during session  Home Living                                          Prior Functioning/Environment  Frequency  Min 3X/week        Progress Toward Goals  OT Goals(current goals can now be found in the care plan section)  Progress towards OT goals: Progressing toward goals     Plan Discharge plan remains appropriate    Co-evaluation                 End of Session    OT Visit Diagnosis: Cognitive communication deficit (R41.841) Symptoms and signs involving cognitive functions: Nontraumatic intracerebral hemorrhage   Activity Tolerance Patient tolerated treatment well   Patient Left in bed;with call bell/phone within reach;with nursing/sitter in room   Nurse Communication Mobility status        Time: 1610-9604 OT Time Calculation (min): 39 min  Charges: OT General Charges $OT Visit: 1 Procedure OT Treatments $Therapeutic Activity: 38-52 mins  [employer], OTR/L [phone]    Jeani Hawking M 01/18/2017, 4:37 PM

## 2017-01-18 NOTE — Progress Notes (Addendum)
Subjective: Patient reports "I'm feeling better. I started some of my schoolwork yesterday"  Objective: Vital signs in last 24 hours: Temp:  [98.5 F (36.9 C)-99.1 F (37.3 C)] 98.9 F (37.2 C) (04/03 0400) Pulse Rate:  [55-89] 73 (04/03 0700) Resp:  [13-28] 25 (04/03 0700) BP: (86-116)/(43-70) 102/70 (04/03 0700) SpO2:  [97 %-100 %] 98 % (04/03 0700)  Intake/Output from previous day: 04/02 0701 - 04/03 0700 In: 4600 [P.O.:640; I.V.:3750; IV Piggyback:210] Out: 2275 [Urine:2275] Intake/Output this shift: Total I/O In: 150 [I.V.:150] Out: 700 [Urine:700]  Alert, conversant, smiling. Reports headaches ~3am last 2 days, otherwise decreasing pain daily. MAEW. No drift. PEARL. Her incision looks good without erythema, swelling, or drainage.  She reports frustration with reading comprehension and difficulty reading her phones screen; but has begun schoolwork via her laptop, and emailed her teacher.   Lab Results: No results for input(s): WBC, HGB, HCT, PLT in the last 72 hours. BMET No results for input(s): NA, K, CL, CO2, GLUCOSE, BUN, CREATININE, CALCIUM in the last 72 hours.  Studies/Results: No results found.  Assessment/Plan:   LOS: 11 days  Continue support. Will discuss with Dr. Venetia Maxon plan for steroids. He will review yesterday's TCD.    Georgiann Cocker 01/18/2017, 8:37 AM    Patient continuing to improve.  Back pain improving.  Taper decadron.  Continue to mobilize TCDs normalizing.

## 2017-01-19 ENCOUNTER — Inpatient Hospital Stay (HOSPITAL_COMMUNITY): Payer: BC Managed Care – PPO

## 2017-01-19 DIAGNOSIS — I671 Cerebral aneurysm, nonruptured: Secondary | ICD-10-CM

## 2017-01-19 MED ORDER — TAB-A-VITE/IRON PO TABS
1.0000 | ORAL_TABLET | Freq: Every day | ORAL | Status: DC
  Administered 2017-01-19 – 2017-01-22 (×4): 1 via ORAL
  Filled 2017-01-19 (×5): qty 1

## 2017-01-19 NOTE — Progress Notes (Signed)
Transcranial Doppler  Date POD PCO2 HCT BP  MCA ACA PCA OPHT SIPH VERT Basilar  01/10/17 MS     Right  Left   159  148   -86  -101   *  43   24  9   *  *   -38  -44   *  *    3/28/18rds     Right  Left   162  125   -90  50   26  *   *  *   *  *   *  *   *  *    01/14/17 MS     Right  Left   141  99   -73  -90   68  51   *  14   *  *   -28  -17   *      01/17/17 rds      Right  Left   106  122   -30  -48   30  15   36  30   61  54   -64  -44   -72      01/19/17 rds     Right  Left   91  144   -73  -41   55  63   -40  -53   -68           Right  Left                                            Right  Left                                        MCA = Middle Cerebral Artery      OPHT = Opthalmic Artery     BASILAR = Basilar Artery   ACA = Anterior Cerebral Artery     SIPH = Carotid Siphon PCA = Posterior Cerebral Artery   VERT = Verterbral Artery                   Normal MCA = 62+\-12 ACA = 50+\-12 PCA = 42+\-23   01/10/17- Right Lindegaard ratio= 5.7    Left Lindegaard ratio= 4.0 01/10/17- Preliminary results discussed with Dr. Conchita Paris and Toma Copier, RN. 01/12/17  -  (*) not able to insonate due to machine malfunction. Right Lindegaard ratio =4.9 - Left Lindegaard ratio = 4.3, RDS 01/14/17- *Unable to insonate. Right Lindegaard ratio=4.6, Left Lindegaard ratio=2.7.  01/17/17 - Right Lindegaard ratio = 2.8, Left Lindegaard ratio = 3.3 Alexza Norbeck, BS, RDMS, RVT 01/19/17 - Right Lindegaard ratio = 3.1, Left Lindegaard ratio = 5.5. Marilynne Halsted, BS, RDMS, RVT

## 2017-01-19 NOTE — Progress Notes (Signed)
Subjective: Patient reports headache at night  Objective: Vital signs in last 24 hours: Temp:  [98.1 F (36.7 C)-98.7 F (37.1 C)] 98.6 F (37 C) (04/04 0400) Pulse Rate:  [54-99] 65 (04/04 0600) Resp:  [12-27] 22 (04/04 0600) BP: (85-130)/(44-86) 90/77 (04/04 0600) SpO2:  [95 %-100 %] 100 % (04/04 0600)  Intake/Output from previous day: 04/03 0701 - 04/04 0700 In: 3810 [I.V.:3600; IV Piggyback:210] Out: 4600 [Urine:4600] Intake/Output this shift: No intake/output data recorded.  Physical Exam: Up and walking.  Neuro exam stable.    Lab Results: No results for input(s): WBC, HGB, HCT, PLT in the last 72 hours. BMET No results for input(s): NA, K, CL, CO2, GLUCOSE, BUN, CREATININE, CALCIUM in the last 72 hours.  Studies/Results: No results found.  Assessment/Plan: Patient doing better.  TCDs today.      LOS: 12 days    Dorian Heckle, MD 01/19/2017, 8:19 AM

## 2017-01-19 NOTE — Progress Notes (Signed)
Occupational Therapy Treatment Patient Details Name: Bonnie Tran MRN: [id number] DOB: [birth year] Today's Date: 01/19/2017    History of present illness Pt is 41 y.o. female who presents to ED on 01/06/17 post L PCA aneurysm and SAH, now s/p L Pterional crani and coiling. Intubated 3/22 - 3/24.  No PMH.     OT comments  Pt is able to perform ADLs with mod I - requires assist for line management only.   Follow Up Recommendations  Outpatient OT;Supervision/Assistance - 24 hour    Equipment Recommendations  None recommended by OT    Recommendations for Other Services      Precautions / Restrictions Precautions Precautions: None       Mobility Bed Mobility Overal bed mobility: Independent                Transfers Overall transfer level: Independent                    Balance             Standing balance-Leahy Scale: Good                             ADL either performed or assessed with clinical judgement   ADL Overall ADL's : Modified independent                                       General ADL Comments: Pt standing at sink to wash hair - assisted with line management, but otherwise mod I      Vision       Perception     Praxis      Cognition Arousal/Alertness: Awake/alert Behavior During Therapy: WFL for tasks assessed/performed Overall Cognitive Status: Impaired/Different from baseline Area of Impairment: Attention                   Current Attention Level: Alternating;Divided           General Comments: Pt reports improved ability to concentrate on school work and able to recall discussions from yesterday.         Exercises     Shoulder Instructions       General Comments      Pertinent Vitals/ Pain       Pain Assessment: Faces Faces Pain Scale: Hurts a little bit Pain Location: Headache Pain Descriptors / Indicators: Headache Pain Intervention(s): Monitored during session  Home  Living                                          Prior Functioning/Environment              Frequency  Min 3X/week        Progress Toward Goals  OT Goals(current goals can now be found in the care plan section)  Progress towards OT goals: Progressing toward goals     Plan Discharge plan remains appropriate    Co-evaluation                 End of Session    OT Visit Diagnosis: Cognitive communication deficit (R41.841) Symptoms and signs involving cognitive functions: Nontraumatic intracerebral hemorrhage   Activity Tolerance Patient tolerated treatment well   Patient Left in bed;with call bell/phone  within reach   Nurse Communication Mobility status        Time: 1610-9604 OT Time Calculation (min): 20 min  Charges: OT General Charges $OT Visit: 1 Procedure OT Treatments $Self Care/Home Management : 8-22 mins  [employer], OTR/L [phone]    Jeani Hawking M 01/19/2017, 2:26 PM

## 2017-01-19 NOTE — Progress Notes (Signed)
SLP Cancellation Note  Patient Details Name: [name] MRN: [id number] DOB: 1976-04-20   Cancelled treatment:       Reason Eval/Treat Not Completed: Other (comment) Pt was sleeping and politely requested that SLP return later, saying that she feels like she needs rest right now. She does say that she used strategies from session on previous date to complete an entire chapter from her biology textbook yesterday. Will f/u as able.   Maxcine Ham 01/19/2017, 11:57 AM   Maxcine Ham, M.A. CCC-SLP [phone]

## 2017-01-20 NOTE — Progress Notes (Signed)
  Speech Language Pathology Treatment: Cognitive-Linquistic  Patient Details Name: Bonnie Tran MRN: [id number] DOB: 10-Jul-1976 Today's Date: 01/20/2017 Time: 4098-1191 SLP Time Calculation (min) (ACUTE ONLY): 30 min  Assessment / Plan / Recommendation Clinical Impression  Pt continues to require Min cues and use of comprehension strategies during reading and auditory learning tasks. SLP also provided education about the need for taking more rest breaks PRN and provided paper for her to take notes as she's listening to online lectures. Suggested that she talk to her school about accommodations that they may be able to offer if she decides to continue to pursue her coursework (she still has some time before she needs to decide about dropping her current classes). Will continue to follow to work on high-level linguistic therapy.   HPI HPI: Bonnie Tran a 41 y.o.femalewho presented 01/06/17 with sudden onset, severe, throbbing HAwith associated nausea.  Per pt, she was doing shoulder presses at the gym, when she suddenly began to experience an intense throbbing pain in her posterior neck/head. 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 She is s/p left pterional craniotomy, clipping of posterior communicating artery aneurysm. She was intubated 01/06/17 to 01/08/17. Referred for bedside swallowing evaluation, cognitive-linguistic evaluation.      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 CCC-SLP [phone]

## 2017-01-20 NOTE — Progress Notes (Addendum)
Subjective: Patient reports doing well.   Objective: Vital signs in last 24 hours: Temp:  [98.4 F (36.9 C)-99.2 F (37.3 C)] 98.7 F (37.1 C) (04/05 1200) Pulse Rate:  [65-98] 76 (04/04 2000) Resp:  [16-27] 20 (04/05 1200) BP: (84-120)/(45-107) 109/58 (04/05 1200) SpO2:  [97 %-100 %] (P) 99 % (04/05 1200)  Intake/Output from previous day: 04/04 0701 - 04/05 0700 In: 5130 [P.O.:1320; I.V.:3600; IV Piggyback:210] Out: 2900 [Urine:2900] Intake/Output this shift: Total I/O In: 150 [I.V.:150] Out: -   Physical Exam: Patient up and about.  Headache not severe.  Lab Results: No results for input(s): WBC, HGB, HCT, PLT in the last 72 hours. BMET No results for input(s): NA, K, CL, CO2, GLUCOSE, BUN, CREATININE, CALCIUM in the last 72 hours.  Studies/Results: No results found.  Assessment/Plan: TCDs improved.  Patient doing well.  Transfer to floor. Acute blood loss anemia (and the result of hemodiluation) stable.   LOS: 13 days    Dorian Heckle, MD 01/20/2017, 12:27 PM

## 2017-01-20 NOTE — Progress Notes (Signed)
Pt arrived to 5C08 via  Wheelchair, alert and oriented.  VSS.  Will continue to monitor.  Sondra Come, RN

## 2017-01-21 ENCOUNTER — Inpatient Hospital Stay (HOSPITAL_COMMUNITY): Payer: BC Managed Care – PPO

## 2017-01-21 DIAGNOSIS — I671 Cerebral aneurysm, nonruptured: Secondary | ICD-10-CM

## 2017-01-21 MED ORDER — NIMODIPINE 30 MG PO CAPS: 60.0000 mg | ORAL_CAPSULE | ORAL | 0 refills | Status: AC

## 2017-01-21 MED ORDER — LEVETIRACETAM 500 MG PO TABS: 500.0000 mg | ORAL_TABLET | Freq: Two times a day (BID) | ORAL | 0 refills | Status: AC

## 2017-01-21 MED ORDER — HYDROCODONE-ACETAMINOPHEN 5-325 MG PO TABS: 1.0000 | ORAL_TABLET | ORAL | 0 refills | Status: DC | PRN

## 2017-01-21 MED ORDER — LEVETIRACETAM 500 MG PO TABS
500.0000 mg | ORAL_TABLET | Freq: Two times a day (BID) | ORAL | Status: DC
  Administered 2017-01-21 – 2017-01-22 (×2): 500 mg via ORAL
  Filled 2017-01-21 (×2): qty 1

## 2017-01-21 NOTE — Progress Notes (Signed)
Received verbal order to remove pt staples. 33 Staples removed. Site dry, open to air. No complaints of pain or discomfort. Will continue to monitor.

## 2017-01-21 NOTE — Discharge Summary (Addendum)
Physician Discharge Summary  Patient ID: Bonnie Tran MRN: [id number] DOB/AGE: 1976-09-13 41 y.o.  Admit date: 01/06/2017 Discharge date: 01/22/2017  Admission Diagnoses:Subarachnoid hemorrhage from left posterior communicating artery aneurysm  Discharge Diagnoses: Same Active Problems:   SAH (subarachnoid hemorrhage) (HCC)   Subarachnoid bleed [facility])   Discharged Condition: good  Hospital Course: Patient was admitted with severe headache after subarachnoid hemorrhage due to left posterior communicating artery aneurysm rupture.  Dr. Conchita Paris clipped the aneurysm via left pterional craniotomy after he was unable to treat the aneurysm via endovascular approach.  Patient had vasospasm postoperatively, which was treated with [facility] therapy.  She had back and leg pain, which improved with decadron.  Patient continued to do well and TCDs normalized.  She was transferred from ICU on 01/20/17 and discharged home on the morning of 01/22/17. Feels well this am. Okay for discharge.  Consults: None  Significant Diagnostic Studies: Angiography, Transcranial Dopplers  Treatments: surgery:Dr. Nundkumar clipped the aneurysm via left pterional craniotomy after he was unable to treat the aneurysm via endovascular approach.   Discharge Exam: Blood pressure (!) 90/46, pulse 72, temperature 97.8 F (36.6 C), temperature source Oral, resp. rate 16, height  (1.626 m), weight 61 kg (134 lb 7.7 oz), last menstrual period 01/06/2017, SpO2 100 %. Neurologic: Alert and oriented X 3, normal strength and tone. Normal symmetric reflexes. Normal coordination and gait Wound:CDI  Disposition: Home  Discharge Instructions    Ambulatory referral to Occupational Therapy    Complete by:  As directed    Ambulatory referral to Speech Therapy    Complete by:  As directed    Diet - low sodium heart healthy    Complete by:  As directed    Increase activity slowly    Complete by:  As directed      Allergies as of 01/22/2017    No Known Allergies     Medication List    TAKE these medications   amphetamine-dextroamphetamine 10 MG tablet Commonly known as:  ADDERALL Take 10 mg by mouth 2 (two) times daily with a meal.   citalopram 10 MG tablet Commonly known as:  CELEXA Take 10 mg by mouth daily.   clonazePAM 1 MG tablet Commonly known as:  KLONOPIN Take 0.5-1 mg by mouth at bedtime as needed for anxiety.   HYDROcodone-acetaminophen 5-325 MG tablet Commonly known as:  NORCO/VICODIN Take 1 tablet by mouth every 4 (four) hours as needed for moderate pain.   levETIRAcetam 500 MG tablet Commonly known as:  KEPPRA Take 1 tablet (500 mg total) by mouth every 12 (twelve) hours.   niMODipine 30 MG capsule Commonly known as:  NIMOTOP Take 2 capsules (60 mg total) by mouth every 4 (four) hours.      Follow-up Information    [facility] Follow [facility].   Specialty:  Rehabilitation Why:  Referral placed for outpatient speech and occupational therapy. Please call if you do not receive a call within a week. Contact information: [address] [id number] mc [place] [address] [phone]          Signed: Alyson Ingles, MD 01/22/2017, 8:43 AM

## 2017-01-21 NOTE — Progress Notes (Signed)
Subjective: Patient reports doing well  Objective: Vital signs in last 24 hours: Temp:  [98.4 F (36.9 C)-99.4 F (37.4 C)] 99.4 F (37.4 C) (04/06 1632) Pulse Rate:  [71-82] 79 (04/06 1632) Resp:  [16-18] 18 (04/06 1632) BP: (88-107)/(39-63) 107/51 (04/06 1632) SpO2:  [100 %] 100 % (04/06 1632)  Intake/Output from previous day: 04/05 0701 - 04/06 0700 In: 450 [I.V.:450] Out: 1 [Stool:1] Intake/Output this shift: No intake/output data recorded.  Physical Exam: Mild headache, non-focal exam.  Visiting with family.  Lab Results: No results for input(s): WBC, HGB, HCT, PLT in the last 72 hours. BMET No results for input(s): NA, K, CL, CO2, GLUCOSE, BUN, CREATININE, CALCIUM in the last 72 hours.  Studies/Results: No results found.  Assessment/Plan: Discharge in AM.  Staples out this afternoon.  Complete 21 days Nimotop.  F/U Dr. Conchita Paris in 3 weeks in office.    LOS: 14 days    Dorian Heckle, MD 01/21/2017, 5:22 PM

## 2017-01-21 NOTE — Progress Notes (Signed)
Transcranial Doppler  Date POD PCO2 HCT BP  MCA ACA PCA OPHT SIPH VERT Basilar  01/10/17 MS     Right  Left   159  148   -86  -101   *  43   24  9   *  *   -38  -44   *  *    01/12/17 rds     Right  Left   162  125   -90  50   26  *   *  *   *  *   *  *   *  *    01/14/17 MS     Right  Left   141  99   -73  -90   68  51   *  14   *  *   -28  -17   *      01/17/17 rds      Right  Left   106  122   -30  -48   30  15   36  30   61  54   -64  -44   -72      01/19/17 rds     Right  Left   91  144   -73  -41   55  63   -40  -53   -68         01/21/17     JE     Right  Left   100  66   -89  -71   48  *   29  27   32  35   -36  -28   -80           Right  Left                                        MCA = Middle Cerebral Artery      OPHT = Opthalmic Artery     BASILAR = Basilar Artery   ACA = Anterior Cerebral Artery     SIPH = Carotid Siphon PCA = Posterior Cerebral Artery   VERT = Verterbral Artery                   Normal MCA = 62+\-12 ACA = 50+\-12 PCA = 42+\-23   01/10/17- Right Lindegaard ratio= 5.7    Left Lindegaard ratio= 4.0 01/10/17- Preliminary results discussed with Dr. Conchita Paris and Toma Copier, RN. 01/12/17  -  (*) not able to insonate due to machine malfunction. Right Lindegaard ratio =4.9 - Left Lindegaard ratio = 4.3, RDS 01/14/17- *Unable to insonate. Right Lindegaard ratio=4.6, Left Lindegaard ratio=2.7.  01/17/17 - Right Lindegaard ratio = 2.8, Left Lindegaard ratio = 3.3 Rita Sturdivant, BS, RDMS, RVT 01/19/17 - Right Lindegaard ratio = 3.1, Left Lindegaard ratio = 5.5. Marilynne Halsted, BS, RDMS, RVT

## 2017-01-22 NOTE — Progress Notes (Signed)
Patient left unit by wheelchair accompanied by staff with all belongings.

## 2017-01-22 NOTE — Progress Notes (Signed)
Patient given discharge instructions.  Patient family concerned on getting Nimotop medications.  Staff called pharmacy listed in chart to verify this medication.  Pharmacy staff stated they do not have medication, but will call other pharmacies and have family receive medication from other pharmacy.  Family made aware of this  And has agreed.  All other questions and concerns addressed.

## 2017-01-28 ENCOUNTER — Ambulatory Visit: Payer: BC Managed Care – PPO

## 2017-01-28 ENCOUNTER — Ambulatory Visit: Payer: BC Managed Care – PPO | Attending: Neurosurgery | Admitting: Occupational Therapy

## 2017-01-28 DIAGNOSIS — R4701 Aphasia: Secondary | ICD-10-CM

## 2017-01-28 DIAGNOSIS — R41842 Visuospatial deficit: Secondary | ICD-10-CM | POA: Insufficient documentation

## 2017-01-28 DIAGNOSIS — M6281 Muscle weakness (generalized): Secondary | ICD-10-CM | POA: Diagnosis present

## 2017-01-28 DIAGNOSIS — R41844 Frontal lobe and executive function deficit: Secondary | ICD-10-CM

## 2017-01-28 NOTE — Therapy (Signed)
[facility], [place] [address] [place], [place], [address] Phone: [phone]   Fax:  [phone]  Occupational Therapy Evaluation  Patient Details  Name: Bonnie Tran MRN: [id number] Date of Birth: 01-25-1976 Referring Provider: Dr. Venetia Maxon  Encounter Date: 01/28/2017      OT End of Session - 01/28/17 1621    Visit Number 1   Number of Visits 25   Date for OT Re-Evaluation 04/27/17   Authorization Type [employer]   OT Start Time 0935   OT Stop Time 1015   OT Time Calculation (min) 40 min   Activity Tolerance Patient tolerated treatment well   Behavior During Therapy [facility] for tasks assessed/performed      No past medical history on file.  Past Surgical History:  Procedure Laterality Date  . CRANIOTOMY Left 01/07/2017   Procedure: CRANIOTOMY INTRACRANIAL ANEURYSM FOR CAROTID;  Surgeon: Lisbeth Renshaw, MD;  Location: [facility] OR;  Service: Neurosurgery;  Laterality: Left;  . IR GENERIC HISTORICAL  01/07/2017   IR ANGIO INTRA EXTRACRAN SEL INTERNAL CAROTID BILAT MOD SED 01/07/2017 Lisbeth Renshaw, MD [facility]-INTERV RAD  . IR GENERIC HISTORICAL  01/07/2017   IR ANGIO VERTEBRAL SEL VERTEBRAL BILAT MOD SED 01/07/2017 Lisbeth Renshaw, MD [facility]-INTER[facility]  . RADIOLOGY WITH ANESTHESIA N/A 01/07/2017   Procedure: RADIOLOGY WITH ANESTHESIA;  Surgeon: Medication Radiologist, MD;  Location: [facility] OR;  Service: Radiology;  Laterality: N/A;    There were no vitals filed for this visit.      Subjective Assessment - 01/28/17 0942    Subjective  Pt s/p subarachnoid hemorrhage after lifting weights at the gym   Pertinent History Pt s/p subarachnoid hemorrhage after lifting weights at the gym, ADD   Patient Stated Goals to get back to normal   Currently in Pain? No/denies           [facility] OT Assessment - 01/28/17 0950      Assessment   Diagnosis SAH, s/p craniotomy and coiling   Referring Provider Dr. Venetia Maxon   Onset Date 01/06/17   Prior Therapy  acute OT     Precautions   Precautions Other (comment)   Precaution Comments visual perceptual deficits, therapist recommended pt does not drive due to visual perceptual deficits(pt reports MD cleared her)     Home  Environment   Family/patient expects to be discharged to: Private residence   Home Access Ramped entrance   Lives With Other (Comment)  uncle and aunt      Prior Function   Level of Independence Independent   Leisure gym     ADL   ADL comments modified indepedendent for basic ADLS  Pt reports she fatigues quickly     IADL   Shopping Needs to be accompanied on any shopping trip   Light Housekeeping Needs help with all home maintenance tasks   Meal Prep Needs to have meals prepared and served   Medication Management Has difficulty remembering to take medication  aunt is helping pt, pt forgets    Financial Management Requires assistance  uses electronic bill pay     Mobility   Mobility Status --  modified indepdent, fatigues quickly     Written Expression   Dominant Hand Right     Vision Assessment   Vision Assessment Vision tested   Ocular Range of Motion Within Functional Limits   Convergence Impaired (comment)   Diplopia Assessment Objects split side to side   Patient has diffculty with activities due to visual impairment Reading bills  Comment Reports blurriness and possible central visual field defict left eye     Activity Tolerance   Activity Tolerance --  can walk for approximately 30 mins without rest break     Cognition   Overall Cognitive Status Impaired/Different from baseline  Cognition to be further assesse   Area of Impairment Safety/judgement;Awareness;Problem solving;Attention   Attention Comments decreased divided/ alternating attention.   Safety/Judgement Decreased awareness of deficits   Executive Function --  to be further assessed   Behaviors Lability  tearful, emotional throughout eval     Coordination   Gross Motor Movements  are Fluid and Coordinated Yes   Fine Motor Movements are Fluid and Coordinated Yes   9 Hole Peg Test Right;Left   Right 9 Hole Peg Test 22.91 secs   Left 9 Hole Peg Test 24.25 secs     ROM / Strength   AROM / PROM / Strength AROM;Strength     AROM   Overall AROM  Within functional limits for tasks performed     Strength   Overall Strength Comments 4/5-4+/5     Hand Function   Right Hand Grip (lbs) 85 lbs   Left Hand Grip (lbs) 70 lbs            Pt was tearful throughtout evaluation. Pt reports that her uncle has had her stop her celexa and adderall. Pt/ and her uncle were instructed to contact MD regarding any medication changes.               OT Short Term Goals - 01/28/17 1623      OT SHORT TERM GOAL #1   Title I with HEP-03/16/17   Time 6   Period Weeks   Status New     OT SHORT TERM GOAL #2   Title Pt will verbalize understnading of compensatory strategies for cognitive/ visual deficits.   Time 6   Period Weeks   Status New     OT SHORT TERM GOAL #3   Title Further assess cognition and set additional goals PRN.   Time 6   Period Weeks   Status New     OT SHORT TERM GOAL #4   Title Pt will perform tabletop scanning with 90% accuracy.   Time 6   Period Weeks   Status New     OT SHORT TERM GOAL #5   Title Pt will perform basic cooking modidifed independently demonstrating good safety awareness.   Time 6   Period Weeks   Status New           OT Long Term Goals - 01/28/17 1625      OT LONG TERM GOAL #1   Title Pt will perfom simulated work activities/ school activities with 95% accuracy.- due 04/27/17   Time 12   Period Weeks   Status New     OT LONG TERM GOAL #2   Title Pt will perform a physical and cognitive task simultaneously with 90 % accuracy.   Time 12   Period Weeks   Status New     OT LONG TERM GOAL #3   Title Pt will perform environmental scanning with 95% accuracy.   Time 12   Period Weeks   Status New     OT  LONG TERM GOAL #4   Title Pt will resume mod complex home management and cooking at a modified independent level.   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 She underwent left pterional craniotomy and coiling 01/07/17. Pt presents with decreased strength, decreased activity tolerance, visual perceptual and cognitive deficits which impde performance of daily activities. Pt can benefit from skilled occupational therapy to maximize safety and independence with ADLs, IADLS and to maintain quality of life. Pt was working as an Print production planner and going to college prior to hospitalization. 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 She has 6 children who are currently staying with their father. Pt is living with her aunt and uncle.   Rehab Potential Good   Clinical Impairments Affecting Rehab Potential decreased awareness of deficits   OT Frequency 2x / week  plus eval   OT Duration 12 weeks   OT Treatment/Interventions Self-care/ADL training;Moist Heat;Fluidtherapy;DME and/or AE instruction;Patient/family education;Balance training;Therapeutic exercises;Ultrasound;Therapeutic exercise;Therapeutic activities;Cognitive remediation/compensation;Neuromuscular education;Cryotherapy;Energy conservation;Manual Therapy;Visual/perceptual remediation/compensation   Plan further assess executive function, consider organizing your day/ MOCHA   Consulted and Agree with Plan of Care Patient      Patient will benefit from skilled therapeutic intervention in order to improve the following deficits and impairments:  Decreased cognition, Impaired vision/preception, Decreased activity tolerance, Decreased endurance, Decreased strength, Impaired UE functional use, Decreased safety awareness, Decreased  knowledge of precautions, Decreased balance  Visit Diagnosis: Visuospatial deficit  Frontal lobe and executive function deficit  Muscle weakness (generalized)    Problem List Patient Active Problem List   Diagnosis Date Noted  . SAH (subarachnoid hemorrhage) (HCC) 01/07/2017  . Subarachnoid bleed (HCC) 01/07/2017    Farhana Fellows 01/28/2017, 4:29 PM Keene Breath, OTR/L [phone] Phone: [phone] 4:36 PM 01/28/17 [facility] [address] [place], [place], [address] Phone: [phone]   Fax:  [phone]  Name: Bonnie Tran MRN: [id number] Date of Birth: 28-May-1976

## 2017-01-28 NOTE — Therapy (Signed)
[facility] [address] [place], [place], [address] Phone: [phone]   Fax:  [phone]  Speech Language Pathology Evaluation  Patient Details  Name: Bonnie Tran MRN: [id number] Date of Birth: 08/13/1976 Referring Provider: Maeola Harman, MD  Encounter Date: 01/28/2017      End of Session - 01/28/17 1144    Visit Number 1   Number of Visits 25   SLP Start Time 0850   SLP Stop Time  0932   SLP Time Calculation (min) 42 min   Activity Tolerance Patient tolerated treatment well      No past medical history on file.  Past Surgical History:  Procedure Laterality Date  . CRANIOTOMY Left 01/07/2017   Procedure: CRANIOTOMY INTRACRANIAL ANEURYSM FOR CAROTID;  Surgeon: Lisbeth Renshaw, MD;  Location: [facility] OR;  Service: Neurosurgery;  Laterality: Left;  . IR GENERIC HISTORICAL  01/07/2017   IR ANGIO INTRA EXTRACRAN SEL INTERNAL CAROTID BILAT MOD SED 01/07/2017 Lisbeth Renshaw, MD [facility]-INTER[facility]  . IR GENERIC HISTORICAL  01/07/2017   IR ANGIO VERTEBRAL SEL VERTEBRAL BILAT MOD SED 01/07/2017 Lisbeth Renshaw, MD [facility]  . RADIOLOGY WITH ANESTHESIA N/A 01/07/2017   Procedure: RADIOLOGY WITH ANESTHESIA;  Surgeon: Medication Radiologist, MD;  Location: [facility] OR;  Service: Radiology;  Laterality: N/A;    There were no vitals filed for this visit.      Subjective Assessment - 01/28/17 0932    Subjective "I'm having trouble finding my words, especially when I describe something." "My spelling is horrible." "The words I read don't make sense like they used to."            SLP Evaluation [facility] - 01/28/17 1145      SLP Visit Information   SLP Received On 01/28/17   Referring Provider Maeola Harman, MD   Onset Date 01-05-17   Medical Diagnosis SAH/intracranial aneurysm     Subjective   Patient/Family Stated Goal Pt conveyed she wants to improve her speech fluidity - she is frustrated by her difficulty finding words  she knows.     General Information   HPI 41 y.o.femalewho presented to ED 01/06/17 with sudden onset, severe, throbbing HAwith associated nausea on 01-05-17 while doing shoulder presses at the gym. Pt was training for body building competition on 01-08-17. 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 She is s/p left pterional craniotomy, clipping of posterior communicating artery aneurysm. She was intubated 01/06/17 to 01/08/17. Referred for follow up OP ST.     Prior Functional Status   Cognitive/Linguistic Baseline Within functional limits   Type of Home House    Lives With Family   Available Support Family   Education some college  currently in college   Vocation Full time employment     Cognition   Overall Cognitive Status --  not formally assessed     Auditory Comprehension   Overall Auditory Comprehension Appears within functional limits for tasks assessed     Reading Comprehension   Reading Status Not tested  pt reports lack of reading comprehension     Verbal Expression   Overall Verbal Expression Impaired   Level of Generative/Spontaneous Verbalization Conversation   Other Verbal Expression Comments Pt's reduced ability to find words was not noted by this SLP until SLP spoke with pt re: mod complex description (what training was like for bodybuilding competitions). Pt did not use compensatory techniques unless cued by SLP to do so, at times appearing fixated on finding the exact word  she wanted.      Written Expression   Overall Writen Expression Pt reports "My spelling is horrible." Writen aphasia strongly suspected.      Motor Speech   Overall Motor Speech Appears within functional limits for tasks assessed                         SLP Education - 01/28/17 1652    Education provided Yes   Education Details ST course, possible goals   Person(s) Educated Patient   Methods Explanation   Comprehension Verbalized understanding           SLP Short Term Goals - 01/28/17 1652      SLP SHORT TERM GOAL #1   Title pt will verbally complete mod complex description tasks 85% with rare min A   Time 4   Period Weeks   Status New     SLP SHORT TERM GOAL #2   Title pt will complete mod complex verbal tasks with 85% success and occasional min A   Time 4   Period Weeks   Status New     SLP SHORT TERM GOAL #3   Title pt will demonstrate ability to write simple one word lists 90% with error awareness/self correction   Time 4   Period Weeks   Status New     SLP SHORT TERM GOAL #4   Title pt will undergo reading assessment post SAH in next 1-2 visits   Time 2   Period Weeks   Status New          SLP Long Term Goals - 01/28/17 1656      SLP LONG TERM GOAL #1   Title pt will complete 20 minutes mod complex/complex verbal linguistic tasks or mod complex/complex conversation with rare min A    Time 8   Period Weeks   Status New     SLP LONG TERM GOAL #2   Title pt will complete 20 minutes complex verbal linguistic tasks or complex conversation with modified independence   Time 12   Period Weeks   Status New     SLP LONG TERM GOAL #3   Title pt will demo ability to write/type functional professional emails with rare min A   Time 8   Period Weeks     SLP LONG TERM GOAL #4   Title pt will demo ability to functionally write/type professional emails re: complex topics with modified independence   Time 12   Period Weeks   Status New     SLP LONG TERM GOAL #5   Title pt will demo understanding of 5-7-sentences of text from a practical source (email, news article, etc)   Time 12   Period Weeks   Status New          Plan - 01/28/17 1145    Clinical Impression Statement 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 Skilled ST is recommended to improve these skills to as close to PLOF as possible. 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    Speech Therapy Frequency 2x / week   Duration --  12 weeks   Treatment/Interventions Language facilitation;Internal/external aids;SLP instruction and feedback;Functional tasks;Patient/family education;Compensatory strategies   Potential to Achieve Goals Good   Consulted and Agree with Plan of Care Patient      Patient will  benefit from skilled therapeutic intervention in order to improve the following deficits and impairments:   Aphasia    Problem List Patient Active Problem List   Diagnosis Date Noted  . SAH (subarachnoid hemorrhage) (HCC) 01/07/2017  . Subarachnoid bleed (HCC) 01/07/2017    [facility] ,MS, CCC-SLP  01/28/2017, 5:04 PM  [facility] [address] [place], [place], [address] Phone: [phone]   Fax:  [phone]  Name: Bonnie Tran MRN: [id number] Date of Birth: June 06, 1976

## 2017-02-01 ENCOUNTER — Ambulatory Visit: Payer: BC Managed Care – PPO | Admitting: Occupational Therapy

## 2017-02-01 ENCOUNTER — Encounter: Payer: BC Managed Care – PPO | Admitting: *Deleted

## 2017-02-02 ENCOUNTER — Ambulatory Visit: Payer: BC Managed Care – PPO

## 2017-02-02 ENCOUNTER — Ambulatory Visit: Payer: BC Managed Care – PPO | Admitting: Occupational Therapy

## 2017-02-02 DIAGNOSIS — R41844 Frontal lobe and executive function deficit: Secondary | ICD-10-CM

## 2017-02-02 DIAGNOSIS — R4701 Aphasia: Secondary | ICD-10-CM

## 2017-02-02 DIAGNOSIS — R41842 Visuospatial deficit: Secondary | ICD-10-CM

## 2017-02-02 DIAGNOSIS — M6281 Muscle weakness (generalized): Secondary | ICD-10-CM

## 2017-02-02 NOTE — Patient Instructions (Signed)
Please consider the following recommendations:  I would recommend taking a break from school to allow for more brain healing, however if you continue to perform school work. I would work 30 mins- 1 hour at a time max before taking a break.   Build several rest breaks into your day where you take a break from work, TV, telephone distractions to allow your brain to rest even if it 20-30 mins.   I have provided you with information regarding a neuro opthamologist, I would recommend that you see him for evaluation and work in therapy on cognitive and visual skills before returning to driving.

## 2017-02-02 NOTE — Patient Instructions (Signed)
  Games for people with word finding issues:  - Scattergories  - Taboo  - Head's Up  - "Family Feud" / "$20,000 Pyramid" games  - "20 questions"  -  Scrabble

## 2017-02-02 NOTE — Therapy (Signed)
[facility] [address] [place], [place], [address] Phone: [phone]   Fax:  [phone]  Speech Language Pathology Treatment  Patient Details  Name: Bonnie Tran MRN: [id number] Date of Birth: 1976/04/27 Referring Provider: Maeola Harman, MD  Encounter Date: 02/02/2017      End of Session - 02/02/17 1454    Visit Number 2   Number of Visits 25   SLP Start Time 1404   SLP Stop Time  1446   SLP Time Calculation (min) 42 min   Activity Tolerance Patient tolerated treatment well      No past medical history on file.  Past Surgical History:  Procedure Laterality Date  . CRANIOTOMY Left 01/07/2017   Procedure: CRANIOTOMY INTRACRANIAL ANEURYSM FOR CAROTID;  Surgeon: Lisbeth Renshaw, MD;  Location: [facility] OR;  Service: Neurosurgery;  Laterality: Left;  . IR GENERIC HISTORICAL  01/07/2017   IR ANGIO INTRA EXTRACRAN SEL INTERNAL CAROTID BILAT MOD SED 01/07/2017 Lisbeth Renshaw, MD [facility]-INTERV RAD  . IR GENERIC HISTORICAL  01/07/2017   IR ANGIO VERTEBRAL SEL VERTEBRAL BILAT MOD SED 01/07/2017 Lisbeth Renshaw, MD [facility]  . RADIOLOGY WITH ANESTHESIA N/A 01/07/2017   Procedure: RADIOLOGY WITH ANESTHESIA;  Surgeon: Medication Radiologist, MD;  Location: [facility] OR;  Service: Radiology;  Laterality: N/A;    There were no vitals filed for this visit.      Subjective Assessment - 02/02/17 1410    Subjective Pt played word games over the weekend.   Patient is accompained by: --  Roena Malady   Currently in Pain? Yes   Pain Score 2    Pain Location Head   Pain Orientation Anterior   Pain Descriptors / Indicators Aching   Pain Type Acute pain   Pain Onset 1 to 4 weeks ago   Pain Frequency Constant   Aggravating Factors  auditory stimulation   Pain Relieving Factors meds               ADULT SLP TREATMENT - 02/02/17 1425      General Information   Behavior/Cognition Alert;Cooperative;Pleasant mood     Treatment  Provided   Treatment provided Cognitive-Linquistic     Cognitive-Linquistic Treatment   Treatment focused on Aphasia;Patient/family/caregiver education   Skilled Treatment Pt reported visual distractions also make sustained/selective attention challenging. SLP targeted education with pt/mother today re: both compensations for word finding as well as games/tasks for improving word finding. In mod difficult/complex description tasks today pt req'd cues to use compensatory strategies as well as for rare assistance in anomic episodes to find appropriate word. "I don't want to dumb down my words," pt stated.     Assessment / Recommendations / Plan   Plan Continue with current plan of care     Progression Toward Goals   Progression toward goals Progressing toward goals          SLP Education - 02/02/17 1454    Education provided Yes   Education Details games/tasks to improve word finding, compensations for anomia   Person(s) Educated Patient;Parent(s)   Methods Explanation;Demonstration;Verbal cues;Handout   Comprehension Verbalized understanding;Returned demonstration;Verbal cues required;Need further instruction          SLP Short Term Goals - 02/02/17 1455      SLP SHORT TERM GOAL #1   Title pt will verbally complete mod complex description tasks 85% with rare min A   Time 4   Period Weeks   Status On-going     SLP SHORT TERM GOAL #  2   Title pt will complete mod complex verbal tasks with 85% success and occasional min A   Time 4   Period Weeks   Status On-going     SLP SHORT TERM GOAL #3   Title pt will demonstrate ability to write simple one word lists 90% with error awareness/self correction   Time 4   Period Weeks   Status On-going     SLP SHORT TERM GOAL #4   Title pt will undergo reading assessment post SAH in next 1-2 visits   Time 2   Period Weeks   Status On-going          SLP Long Term Goals - 02/02/17 1455      SLP LONG TERM GOAL #1   Title pt will  complete 20 minutes mod complex/complex verbal linguistic tasks or mod complex/complex conversation with rare min A    Time 8   Period Weeks   Status On-going     SLP LONG TERM GOAL #2   Title pt will complete 20 minutes complex verbal linguistic tasks or complex conversation with modified independence   Time 12   Period Weeks   Status On-going     SLP LONG TERM GOAL #3   Title pt will demo ability to write/type functional professional emails with rare min A   Time 8   Period Weeks   Status On-going     SLP LONG TERM GOAL #4   Title pt will demo ability to functionally write/type professional emails re: complex topics with modified independence   Time 12   Period Weeks   Status On-going     SLP LONG TERM GOAL #5   Title pt will demo understanding of 5-7-sentences of text from a practical source (email, news article, etc)   Time 12   Period Weeks   Status On-going          Plan - 02/02/17 1454    Clinical Impression Statement Pt cont to present with expressive aphasia with complaints of written aphasia and as receptive language (reading) deficits. Pt reprots writing/typing appears to have improved a bit since eval. Skilled ST is cont'd to be recommended to improve these skills to as close to PLOF as possible. 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    Speech Therapy Frequency 2x / week   Duration --  12 weeks   Treatment/Interventions Language facilitation;Internal/external aids;SLP instruction and feedback;Functional tasks;Patient/family education;Compensatory strategies   Potential to Achieve Goals Good   Consulted and Agree with Plan of Care Patient      Patient will benefit from skilled therapeutic intervention in order to improve the following deficits and impairments:   Aphasia    Problem List Patient Active Problem List   Diagnosis Date Noted  . SAH (subarachnoid hemorrhage) (HCC) 01/07/2017  . Subarachnoid  bleed (HCC) 01/07/2017    [facility] ,MS, CCC-SLP  02/02/2017, 2:56 PM  [facility] [address] [place], [place], [address] Phone: [phone]   Fax:  [phone]   Name: Bonnie Tran MRN: [id number] Date of Birth: [birth year]

## 2017-02-03 ENCOUNTER — Ambulatory Visit: Payer: BC Managed Care – PPO | Admitting: Speech Pathology

## 2017-02-03 ENCOUNTER — Telehealth: Payer: Self-pay | Admitting: Occupational Therapy

## 2017-02-03 ENCOUNTER — Ambulatory Visit: Payer: BC Managed Care – PPO | Admitting: Occupational Therapy

## 2017-02-03 DIAGNOSIS — R41844 Frontal lobe and executive function deficit: Secondary | ICD-10-CM

## 2017-02-03 DIAGNOSIS — R41842 Visuospatial deficit: Secondary | ICD-10-CM

## 2017-02-03 DIAGNOSIS — M6281 Muscle weakness (generalized): Secondary | ICD-10-CM

## 2017-02-03 DIAGNOSIS — R4701 Aphasia: Secondary | ICD-10-CM

## 2017-02-03 NOTE — Patient Instructions (Signed)
When you go into a new environment make sure to stop and survey the environment, turn your head to left and right side and take your time.  Make sure to have someone with you if you are crossing the street, or a busy parking lot.  When you are walking in a new environment make sure to scan side to side turning your head so you do not bump into something.  Hold the "A" card in front of you.  Move it towards you and away from you within the range that it is not double, perform 3-5 reps 2-3x day

## 2017-02-03 NOTE — Therapy (Signed)
[facility] [address] [place], [place], [address] Phone: [phone]   Fax:  [phone]  Occupational Therapy Treatment  Patient Details  Name: Bonnie Tran MRN: [id number] Date of Birth: 04-May-1976 Referring Provider: Dr. Venetia Maxon  Encounter Date: 02/03/2017      OT End of Session - 02/03/17 1530    Visit Number 3   Number of Visits 25   Date for OT Re-Evaluation 04/27/17   Authorization Type [employer]   OT Start Time 1415   OT Stop Time 1510   OT Time Calculation (min) 55 min   Activity Tolerance Patient tolerated treatment well   Behavior During Therapy [facility] for tasks assessed/performed      No past medical history on file.  Past Surgical History:  Procedure Laterality Date  . CRANIOTOMY Left 01/07/2017   Procedure: CRANIOTOMY INTRACRANIAL ANEURYSM FOR CAROTID;  Surgeon: Lisbeth Renshaw, MD;  Location: [facility] OR;  Service: Neurosurgery;  Laterality: Left;  . IR GENERIC HISTORICAL  01/07/2017   IR ANGIO INTRA EXTRACRAN SEL INTERNAL CAROTID BILAT MOD SED 01/07/2017 Lisbeth Renshaw, MD [facility]  . IR GENERIC HISTORICAL  01/07/2017   IR ANGIO VERTEBRAL SEL VERTEBRAL BILAT MOD SED 01/07/2017 Lisbeth Renshaw, MD [facility]  . RADIOLOGY WITH ANESTHESIA N/A 01/07/2017   Procedure: RADIOLOGY WITH ANESTHESIA;  Surgeon: Medication Radiologist, MD;  [facility]: [facility] OR;  Service: Radiology;  Laterality: N/A;    There were no vitals filed for this visit.      Subjective Assessment - 02/03/17 1317    Subjective  Pt reports she has been doing schoolwork for 2 hrs a day   Pertinent History Pt s/p subarachnoid hemorrhage after lifting weights at the gym, ADD   Patient Stated Goals to get back to normal   Currently in Pain? Yes   Pain Score 2    Pain Location Head   Pain Orientation Anterior   Pain Descriptors / Indicators Aching   Pain Type Acute pain   Pain Onset 1 to 4 weeks ago   Pain Frequency Constant   Aggravating  Factors  auditory stimulation   Pain Relieving Factors meds          Treatment: Pt arrived today with a new awareness of deficits reporting yesterday's info was difficult to hear but very beneficial..  Tabletop scanning task for 85M scanning, pt demonstrated disorganized scanning initially, then she modified to organized scan pattern. Pt double checked her work and located missed items. Pt was noted to complete in a very quick impulsive manner, therapist recommends slowing down. Copying small peg design with left and right UE's, min v.c. For directions, pt self corrected an error. Standing functional reach RUE 6 inches, LUE 7 inches. Therapist  to request PT order.  Convergence HEP issued.                    OT Education - 02/03/17 1523    Education provided Yes   Education Details convergence exercise, importance of scanning environment, safety/ supervision crossing street/ parking lot   Person(s) Educated Patient;Parent(s)   Methods Explanation;Verbal cues;Handout   Comprehension Verbalized understanding          OT Short Term Goals - 02/03/17 1527      OT SHORT TERM GOAL #1   Title I with HEP-03/16/17   Time 6   Period Weeks   Status New     OT SHORT TERM GOAL #2   Title Pt will verbalize understanding  of  compensatory strategies for cognitive/ visual deficits.   Time 6   Period Weeks   Status New     OT SHORT TERM GOAL #3   Title Pt will demonstrate ability to perform mod complex organization/ problem solving task with 95% or better accuracy.   Time 6   Period Weeks   Status New     OT SHORT TERM GOAL #4   Title Pt will perform tabletop scanning with 90% accuracy.   Time 6   Period Weeks   Status New     OT SHORT TERM GOAL #5   Title Pt will perform basic cooking modidifed independently demonstrating good safety awareness.   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   Time 12   Period Weeks   Status New     OT LONG TERM GOAL #3   Title Pt will perform environmental scanning with 95% accuracy.   Time 12   Period Weeks   Status New     OT LONG TERM GOAL #4   Title Pt will resume mod complex home management and cooking at a modified independent level.   Time 12   Period Weeks   Status New     OT LONG TERM GOAL #5   Title Pt will demonstrate improved standing balance for ADLS/IADLS as evidenced by increasing bilateral standing functional reach test to 10 inches or greater   Baseline RUE 6 inches, LUE 7 inches   Time 12   Period Weeks   Status New               Plan - 02/03/17 1524    Clinical Impression Statement Pt arrived today demonstrating increased awareness of deficits and verbalizing importance in taking time for healing before retruning to work. Pt reports that she was upset with her perfomance yesterday but it really helped her to be more aware of her deficits.   Rehab Potential Good   OT Frequency 2x / week   OT Duration 12 weeks   OT Treatment/Interventions Self-care/ADL training;Moist Heat;Fluidtherapy;DME and/or AE instruction;Patient/family education;Balance training;Therapeutic exercises;Ultrasound;Therapeutic exercise;Therapeutic activities;Cognitive remediation/compensation;Neuromuscular education;Cryotherapy;Energy conservation;Manual Therapy;Visual/perceptual remediation/compensation   Plan environmental scanning, progress vision HEP, check to see if PT order received.   Consulted and Agree with Plan of Care  Patient;Family member/caregiver   Family Member Consulted mother      Patient will benefit from skilled therapeutic intervention in order to improve the following deficits and impairments:  Decreased cognition, Impaired vision/preception, Decreased activity tolerance, Decreased endurance, Decreased strength, Impaired UE functional use, Decreased safety awareness, Decreased knowledge of precautions, Decreased balance  Visit Diagnosis: Visuospatial deficit  Frontal lobe and executive function deficit  Muscle weakness (generalized)    Problem List Patient Active Problem List   Diagnosis Date Noted  . SAH (subarachnoid hemorrhage) (  HCC) 01/07/2017  . Subarachnoid bleed (HCC) 01/07/2017    Midori Dado 02/03/2017, 3:40 PM  [facility] [address] [place], [place], [address] Phone: [phone]   Fax:  [phone]  Name: Zamani Crocker MRN: [id number] Date of Birth: [birth year]

## 2017-02-03 NOTE — Telephone Encounter (Signed)
Dr. Conchita Paris,  Bonnie Tran is receiving  OT/ST following her [facility].  She reports balance deficits and demonstrates a standing functional reach:RUE 6 inches, LUE 7 inches (<10 inches indicates increased fall risk.) She can benefit from a referral to PT for balance. If you agree, please make this referral.  Sincerely, [facility] [phone] Phone: [phone] 3:49 PM 02/03/17

## 2017-02-03 NOTE — Therapy (Signed)
[facility] [address] [place], [place], [address] Phone: [phone]   Fax:  [phone]  Speech Language Pathology Treatment  Patient Details  Name: Bonnie Tran MRN: [id number] Date of Birth: 09/22/1976 Referring Provider: Maeola Harman, MD  Encounter Date: 02/03/2017      End of Session - 02/03/17 1651    Visit Number 3   Number of Visits 25   SLP Start Time 1317   SLP Stop Time  1401   SLP Time Calculation (min) 44 min   Activity Tolerance Patient tolerated treatment well      No past medical history on file.  Past Surgical History:  Procedure Laterality Date  . CRANIOTOMY Left 01/07/2017   Procedure: CRANIOTOMY INTRACRANIAL ANEURYSM FOR CAROTID;  Surgeon: Lisbeth Renshaw, MD;  Location: [facility] OR;  Service: Neurosurgery;  Laterality: Left;  . IR GENERIC HISTORICAL  01/07/2017   IR ANGIO INTRA EXTRACRAN SEL INTERNAL CAROTID BILAT MOD SED 01/07/2017 Lisbeth Renshaw, MD [facility]  . IR GENERIC HISTORICAL  01/07/2017   IR ANGIO VERTEBRAL SEL VERTEBRAL BILAT MOD SED 01/07/2017 Lisbeth Renshaw, MD [facility]  . RADIOLOGY WITH ANESTHESIA N/A 01/07/2017   Procedure: RADIOLOGY WITH ANESTHESIA;  Surgeon: Medication Radiologist, MD;  Location: [facility] OR;  Service: Radiology;  Laterality: N/A;    There were no vitals filed for this visit.      Subjective Assessment - 02/03/17 1327    Subjective "I just fell asleep - so I didn't get the homework done"               ADULT SLP TREATMENT - 02/03/17 1328      General Information   Behavior/Cognition Alert;Cooperative;Pleasant mood     Treatment Provided   Treatment provided Cognitive-Linquistic     Cognitive-Linquistic Treatment   Treatment focused on Aphasia;Patient/family/caregiver education   Skilled Treatment Facilitated compensations for word finding with generating  3-4 descriptives for medium frequency words for ST to identify.  Verbal expression  facitiated generating similarities and differences . Written expression targeted having pt write 2 sentences with multiple meaning words, with rare min verbal cues for articles and grammatical markers. Written expression homework provided     Assessment / Recommendations / Plan   Plan Continue with current plan of care     Progression Toward Goals   Progression toward goals Progressing toward goals          SLP Education - 02/03/17 1649    Education provided Yes   Education Details compensations for anomic episodes   Person(s) Educated Patient;Parent(s)   Methods Explanation;Verbal cues;Demonstration   Comprehension Verbalized understanding;Returned demonstration;Need further instruction          SLP Short Term Goals - 02/03/17 1651      SLP SHORT TERM GOAL #1   Title pt will verbally complete mod complex description tasks 85% with rare min A   Time 4   Period Weeks   Status On-going     SLP SHORT TERM GOAL #2   Title pt will complete mod complex verbal tasks with 85% success and occasional min A   Time 4   Period Weeks   Status On-going     SLP SHORT TERM GOAL #3   Title pt will demonstrate ability to write simple one word lists 90% with error awareness/self correction   Time 4   Period Weeks   Status On-going     SLP SHORT TERM GOAL #4   Title pt will undergo reading  assessment post SAH in next 1-2 visits   Time 2   Period Weeks   Status On-going          SLP Long Term Goals - 02/03/17 1651      SLP LONG TERM GOAL #1   Title pt will complete 20 minutes mod complex/complex verbal linguistic tasks or mod complex/complex conversation with rare min A    Time 8   Period Weeks   Status On-going     SLP LONG TERM GOAL #2   Title pt will complete 20 minutes complex verbal linguistic tasks or complex conversation with modified independence   Time 12   Period Weeks   Status On-going     SLP LONG TERM GOAL #3   Title pt will demo ability to write/type  functional professional emails with rare min A   Time 8   Period Weeks   Status On-going     SLP LONG TERM GOAL #4   Title pt will demo ability to functionally write/type professional emails re: complex topics with modified independence   Time 12   Period Weeks   Status On-going     SLP LONG TERM GOAL #5   Title pt will demo understanding of 5-7-sentences of text from a practical source (email, news article, etc)   Time 12   Period Weeks   Status On-going          Plan - 02/03/17 1650    Clinical Impression Statement Pt cont to present with expressive aphasia with complaints of written aphasia and as receptive language (reading) deficits. Pt reprots writing/typing appears to have improved a bit since eval. Skilled ST is cont'd to be recommended to improve these skills to as close to PLOF as possible. 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    Speech Therapy Frequency 2x / week   Treatment/Interventions Language facilitation;Internal/external aids;SLP instruction and feedback;Functional tasks;Patient/family education;Compensatory strategies   Potential to Achieve Goals Good   Consulted and Agree with Plan of Care Patient      Patient will benefit from skilled therapeutic intervention in order to improve the following deficits and impairments:   Aphasia    Problem List Patient Active Problem List   Diagnosis Date Noted  . SAH (subarachnoid hemorrhage) (HCC) 01/07/2017  . Subarachnoid bleed (HCC) 01/07/2017    Lovvorn, Radene Journey  MS, CCC-SLP 02/03/2017, 4:52 PM   [facility] [address] [place], [place], [address] Phone: [phone]   Fax:  [phone]   Name: Bonnie Tran MRN: [id number] Date of Birth: 10-06-1976

## 2017-02-03 NOTE — Therapy (Signed)
[facility] [address] [place], [place], [address] Phone: [phone]   Fax:  [phone]  Occupational Therapy Treatment  Patient Details  Name: Bonnie Tran MRN: [id number] Date of Birth: 1976/07/18 Referring Provider: Dr. Venetia Maxon  Encounter Date: 02/02/2017      OT End of Session - 02/03/17 1319    Visit Number 2   Number of Visits 25   Date for OT Re-Evaluation 04/27/17   Authorization Type BCBS   OT Start Time 1320   OT Stop Time 1400   OT Time Calculation (min) 40 min   Activity Tolerance Patient tolerated treatment well   Behavior During Therapy [facility] for tasks assessed/performed      No past medical history on file.  Past Surgical History:  Procedure Laterality Date  . CRANIOTOMY Left 01/07/2017   Procedure: CRANIOTOMY INTRACRANIAL ANEURYSM FOR CAROTID;  Surgeon: Lisbeth Renshaw, MD;  Location: [facility] OR;  Service: Neurosurgery;  Laterality: Left;  . IR GENERIC HISTORICAL  01/07/2017   IR ANGIO INTRA EXTRACRAN SEL INTERNAL CAROTID BILAT MOD SED 01/07/2017 Lisbeth Renshaw, MD [facility]-INTER[facility] RAD  . IR GENERIC HISTORICAL  01/07/2017   IR ANGIO VERTEBRAL SEL VERTEBRAL BILAT MOD SED 01/07/2017 Lisbeth Renshaw, MD [facility]  . RADIOLOGY WITH ANESTHESIA N/A 01/07/2017   Procedure: RADIOLOGY WITH ANESTHESIA;  Surgeon: Medication Radiologist, MD;  Location: [facility] OR;  Service: Radiology;  Laterality: N/A;    There were no vitals filed for this visit.      Subjective Assessment - 02/03/17 1317    Subjective  Pt reports she has been doing schoolwork for 2 hrs a day   Pertinent History Pt s/p subarachnoid hemorrhage after lifting weights at the gym, ADD   Patient Stated Goals to get back to normal   Currently in Pain? Yes   Pain Score 2    Pain Location Head   Pain Orientation Anterior   Pain Descriptors / Indicators Aching   Pain Type Acute pain   Pain Onset 1 to 4 weeks ago   Pain Frequency Constant   Aggravating  Factors  auditory stimulation   Pain Relieving Factors meds             Pt copied a cube correctly yet incorrectly completed clock drawing task. (Numbers were missing and the time drawn was incorrect.) Pt was able to complete trailmaking task B correctly with increased time. Organizing your day task, with min v.c. To complete the correct problem and to finish copying onto schedule, pt included all items and organized correctly. Therapsit discussed that this attention to detail may impact her schoolwork. Environmental scanning task 12/14 located first pass, pt missed 2 items on left side and she was noted not to turn her head.                   OT Short Term Goals - 01/28/17 1623      OT SHORT TERM GOAL #1   Title I with HEP-03/16/17   Time 6   Period Weeks   Status New     OT SHORT TERM GOAL #2   Title Pt will verbalize understnading of compensatory strategies for cognitive/ visual deficits.   Time 6   Period Weeks   Status New     OT SHORT TERM GOAL #3   Title Further assess cognition and set additional goals PRN.   Time 6   Period Weeks   Status New     OT SHORT TERM GOAL #4  Title Pt will perform tabletop scanning with 90% accuracy.   Time 6   Period Weeks   Status New     OT SHORT TERM GOAL #5   Title Pt will perform basic cooking modidifed independently demonstrating good safety awareness.   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   Time 12   Period Weeks    Status New     OT LONG TERM GOAL #3   Title Pt will perform environmental scanning with 95% accuracy.   Time 12   Period Weeks   Status New     OT LONG TERM GOAL #4   Title Pt will resume mod complex home management and cooking at a modified independent level.   Time 12   Period Weeks   Status New               Plan - 02/03/17 1318    Clinical Impression Statement Pt is progressing towards goals however she remains limited by decreased awareness of deficits.   Clinical Impairments Affecting Rehab Potential decreased awareness of deficits   OT Frequency 2x / week   OT Duration 12 weeks   OT Treatment/Interventions Self-care/ADL training;Moist Heat;Fluidtherapy;DME and/or AE instruction;Patient/family education;Balance training;Therapeutic exercises;Ultrasound;Therapeutic exercise;Therapeutic activities;Cognitive remediation/compensation;Neuromuscular education;Cryotherapy;Energy conservation;Manual Therapy;Visual/perceptual remediation/compensation   Plan continue to address visual perceptual and cognitive skills   Consulted and Agree with Plan of Care Patient;Family member/caregiver   Family Member Consulted mother      Patient will benefit from skilled therapeutic intervention in order to improve the following deficits and impairments:  Decreased cognition, Impaired vision/preception, Decreased activity tolerance, Decreased endurance, Decreased strength, Impaired UE functional use, Decreased safety awareness, Decreased knowledge of precautions, Decreased balance  Visit Diagnosis: Visuospatial deficit  Frontal lobe and executive function deficit  Muscle weakness (generalized)    Problem List Patient Active Problem List   Diagnosis Date Noted  . SAH (subarachnoid hemorrhage) (HCC) 01/07/2017  . Subarachnoid bleed (HCC) 01/07/2017    RINE,KATHRYN 02/03/2017, 1:20 PM   [facility] [address] [place], [place], [address] Phone: [phone]   Fax:  [phone]  Name: Claudean Leavelle MRN: [id number] Date of Birth: [birth year]

## 2017-02-08 ENCOUNTER — Ambulatory Visit: Payer: BC Managed Care – PPO | Admitting: Occupational Therapy

## 2017-02-08 ENCOUNTER — Ambulatory Visit: Payer: BC Managed Care – PPO | Admitting: Speech Pathology

## 2017-02-08 DIAGNOSIS — R4701 Aphasia: Secondary | ICD-10-CM

## 2017-02-08 DIAGNOSIS — R41844 Frontal lobe and executive function deficit: Secondary | ICD-10-CM

## 2017-02-08 DIAGNOSIS — R41842 Visuospatial deficit: Secondary | ICD-10-CM

## 2017-02-08 NOTE — Therapy (Signed)
[facility] [address] [place], [place], [address] Phone: [phone]   Fax:  [phone]  Occupational Therapy Treatment  Patient Details  Name: Bonnie Tran MRN: [id number] Date of Birth: Nov 27, 1975 Referring Provider: Dr. Venetia Maxon  Encounter Date: 02/08/2017      OT End of Session - 02/08/17 1215    Visit Number 4   Number of Visits 25   Date for OT Re-Evaluation 04/27/17   Authorization Type BCBS   OT Start Time 1020   OT Stop Time 1100   OT Time Calculation (min) 40 min   Activity Tolerance Patient tolerated treatment well   Behavior During Therapy [facility] for tasks assessed/performed      No past medical history on file.  Past Surgical History:  Procedure Laterality Date  . CRANIOTOMY Left 01/07/2017   Procedure: CRANIOTOMY INTRACRANIAL ANEURYSM FOR CAROTID;  Surgeon: Lisbeth Renshaw, MD;  [facility];  Service: Neurosurgery;  Laterality: Left;  . IR GENERIC HISTORICAL  01/07/2017   IR ANGIO INTRA EXTRACRAN SEL INTERNAL CAROTID BILAT MOD SED 01/07/2017 Lisbeth Renshaw, MD [facility]  . IR GENERIC HISTORICAL  01/07/2017   IR ANGIO VERTEBRAL SEL VERTEBRAL BILAT MOD SED 01/07/2017 Lisbeth Renshaw, MD [facility]  . RADIOLOGY WITH ANESTHESIA N/A 01/07/2017   Procedure: RADIOLOGY WITH ANESTHESIA;  Surgeon: Medication Radiologist, MD;  [facility] OR;  Service: Radiology;  Laterality: N/A;    There were no vitals filed for this visit.      Subjective Assessment - 02/08/17 1051    Subjective  Pt reports she is working on her balance at home   Pertinent History Pt s/p subarachnoid hemorrhage after lifting weights at the gym, ADD   Patient Stated Goals to get back to normal   Currently in Pain? No/denies         Pt was instructed in updated vision HEP. See pt instructions.  2 M print to cross out the number that repeats in a row, for scanning with cognitive component, with 100%  accuracy. Environmental scanning, 13/14 located first pass, pt located missed item on second pass.                     OT Education - 02/08/17 1216    Education provided Yes   Education Details Diplopia HEP, recommendation that pt considers taking at least a month off from work   [employer]) Educated Patient   Methods Explanation;Demonstration;Verbal cues;Handout   Comprehension Verbalized understanding;Returned demonstration;Verbal cues required          OT Short Term Goals - 02/03/17 1527      OT SHORT TERM GOAL #1   Title I with HEP-03/16/17   Time 6   Period Weeks   Status New     OT SHORT TERM GOAL #2   Title Pt will verbalize understanding  of compensatory strategies for cognitive/ visual deficits.   Time 6   Period Weeks   Status New     OT SHORT TERM GOAL #3   Title Pt will demonstrate ability to perform mod complex organization/ problem solving task with 95% or better accuracy.   Time 6   Period Weeks   Status New     OT SHORT TERM GOAL #4   Title Pt will perform tabletop scanning with 90% accuracy.   Time 6   Period Weeks   Status New     OT SHORT TERM GOAL #5   Title Pt will perform basic cooking  modidifed independently demonstrating good safety awareness.   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   Time 12   Period Weeks   Status New     OT LONG TERM GOAL #3   Title Pt will perform environmental scanning with 95% accuracy.   Time 12   Period Weeks    Status New     OT LONG TERM GOAL #4   Title Pt will resume mod complex home management and cooking at a modified independent level.   Time 12   Period Weeks   Status New     OT LONG TERM GOAL #5   Title Pt will demonstrate improved standing balance for ADLS/IADLS as evidenced by increasing bilateral standing functional reach test to 10 inches or greater   Baseline RUE 6 inches, LUE 7 inches   Time 12   Period Weeks   Status New               Plan - 02/08/17 1210    Clinical Impression Statement Pt is progressing towards goals with improving cognitive and visual perceptual skills   Rehab Potential Good   Clinical Impairments Affecting Rehab Potential decreased awareness of deficits   OT Frequency 2x / week   OT Duration 12 weeks   OT Treatment/Interventions Self-care/ADL training;Moist Heat;Fluidtherapy;DME and/or AE instruction;Patient/family education;Balance training;Therapeutic exercises;Ultrasound;Therapeutic exercise;Therapeutic activities;Cognitive remediation/compensation;Neuromuscular education;Cryotherapy;Energy conservation;Manual Therapy;Visual/perceptual remediation/compensation   Plan check to see if PT order recieved   Consulted and Agree with Plan of Care Patient      Patient will benefit from skilled therapeutic intervention in order to improve the following deficits and impairments:  Decreased cognition, Impaired vision/preception, Decreased activity tolerance, Decreased endurance, Decreased strength, Impaired UE functional use, Decreased safety awareness, Decreased knowledge of precautions, Decreased balance  Visit Diagnosis: Visuospatial deficit  Frontal lobe and executive function deficit    Problem List Patient Active Problem List   Diagnosis Date Noted  . SAH (subarachnoid hemorrhage) (HCC) 01/07/2017  . Subarachnoid bleed (HCC) 01/07/2017    Bonnie Tran 02/08/2017, 12:17 PM  [facility] [address] [place], [place], [address] Phone: [phone]   Fax:  [phone]  Name: Bonnie Tran MRN: [id number] Date of Birth: 06/29/1976

## 2017-02-08 NOTE — Patient Instructions (Addendum)
Diplopia HEP:  Perform at least 2 times per day. Stop if your eye becomes fatigued or hurts and try again later.  1. Hold a small object/card in front of you.  Hold it in the middle at arm's length away.    2. Cover your LEFT eye and look at the object with your RIGHT eye.  3. Slowly move the object side to side in front of you while continuing to watch it with your RIGHT eye.  4.  Remember to keep your head still and only move your eye.  5.  Repeat 5-10 times.  6.  Then, move object up and down while watching it 5-10 times.  7. Cover your RIGHT eye and look at the object with your LEFT eye while you repeat #1-6 above.  8.  Now, uncover both eyes and try to focus on the object while holding it in the middle.  Try to make it 1 image.   9.  If you can, try to hold it for 10-30 sec increasing as able.    10.  Once you can make the image 1 for at least 30 sec in the middle, repeat #1-6 above with both eyes moving slowly and only in the range that you can keep the image 1.

## 2017-02-08 NOTE — Therapy (Signed)
[facility] [address] [place], [place], [address] Phone: [phone]   Fax:  [phone]  Speech Language Pathology Treatment  Patient Details  Name: Bonnie Tran MRN: [id number] Date of Birth: Aug 16, 1976 Referring Provider: Maeola Harman, MD  Encounter Date: 02/08/2017      End of Session - 02/08/17 1055    Visit Number 4   Number of Visits 25   SLP Start Time 0845   SLP Stop Time  0928   SLP Time Calculation (min) 43 min   Activity Tolerance Patient tolerated treatment well      No past medical history on file.  Past Surgical History:  Procedure Laterality Date  . CRANIOTOMY Left 01/07/2017   Procedure: CRANIOTOMY INTRACRANIAL ANEURYSM FOR CAROTID;  Surgeon: Lisbeth Renshaw, MD;  [facility] OR;  Service: Neurosurgery;  Laterality: Left;  . IR GENERIC HISTORICAL  01/07/2017   IR ANGIO INTRA EXTRACRAN SEL INTERNAL CAROTID BILAT MOD SED 01/07/2017 Lisbeth Renshaw, MD [facility]  . IR GENERIC HISTORICAL  01/07/2017   IR ANGIO VERTEBRAL SEL VERTEBRAL BILAT MOD SED 01/07/2017 Lisbeth Renshaw, MD [facility]  . RADIOLOGY WITH ANESTHESIA N/A 01/07/2017   Procedure: RADIOLOGY WITH ANESTHESIA;  Surgeon: Medication Radiologist, MD;  [facility]: [facility] OR;  Service: Radiology;  Laterality: N/A;    There were no vitals filed for this visit.      Subjective Assessment - 02/08/17 0930    Subjective "I was up until 4:30 doing my homework"   Currently in Pain? No/denies               ADULT SLP TREATMENT - 02/08/17 0931      General Information   Behavior/Cognition Alert;Cooperative;Pleasant mood   Patient Positioning Upright in chair   Oral care provided N/A     Treatment Provided   Treatment provided Cognitive-Linquistic     Pain Assessment   Pain Assessment No/denies pain     Cognitive-Linquistic Treatment   Treatment focused on Aphasia;Patient/family/caregiver education   Skilled Treatment SLP  facilitated use of compensations for wordfinding during conversation as pt described her progress and challenges since her discharge from the hospital. During review of pt's home exercises, she stated she was working on the activity until 4:30 am, states she has not been sleeping well. She states she is not happy with the medications prescribed to help her sleep and that she is not taking them as prescribed. SLP recommended pt consult her doctor re: sleep difficulties and to seek MD advice prior to adjusting medications. She recently returned to school and states she is planning to return to work soon, states she has 6 children who will be coming to live with her this summer. SLP provided education regarding recovery from stroke, allowing for periods of rest, scheduling high-cogntive load activities for times of the day when she is most focused. Additional home tasks assigned.     Assessment / Recommendations / Plan   Plan Continue with current plan of care     Progression Toward Goals   Progression toward goals Progressing toward goals          SLP Education - 02/08/17 1054    Education provided Yes   Education Details compensations for anomia, stroke recovery, consult MD re: medication adjustments   Person(s) Educated Patient   Methods Explanation;Verbal cues   Comprehension Verbalized understanding;Returned demonstration;Need further instruction          SLP Short Term Goals - 02/08/17 1058  SLP SHORT TERM GOAL #1   Title pt will verbally complete mod complex description tasks 85% with rare min A   Time 3   Period Weeks   Status On-going     SLP SHORT TERM GOAL #2   Title pt will complete mod complex verbal tasks with 85% success and occasional min A   Time 3   Period Weeks   Status On-going     SLP SHORT TERM GOAL #3   Title pt will demonstrate ability to write simple one word lists 90% with error awareness/self correction   Time 3   Period Weeks   Status On-going           SLP Long Term Goals - 02/08/17 1059      SLP LONG TERM GOAL #1   Title pt will complete 20 minutes mod complex/complex verbal linguistic tasks or mod complex/complex conversation with rare min A    Time 7   Period Weeks   Status On-going     SLP LONG TERM GOAL #2   Title pt will complete 20 minutes complex verbal linguistic tasks or complex conversation with modified independence   Time 11   Period Weeks   Status On-going     SLP LONG TERM GOAL #3   Title pt will demo ability to write/type functional professional emails with rare min A   Time 8   Period Weeks   Status On-going     SLP LONG TERM GOAL #4   Title pt will demo ability to functionally write/type professional emails re: complex topics with modified independence   Time 11   Period Weeks   Status On-going     SLP LONG TERM GOAL #5   Title pt will demo understanding of 5-7-sentences of text from a practical source (email, news article, etc)   Time 11   Period Weeks   Status On-going          Plan - 02/08/17 1055    Clinical Impression Statement Pt cont to present with expressive aphasia with complaints of written aphasia and as receptive language (reading) deficits. Pt reports improvements in writing/typing, wordfinding since eval. She reports a history of ADHD. Pt appears highly motivated to improve, was educated re: prioritizing her goals and reducing number of tasks she takes on as she is recovering.  Skilled ST is cont'd to be recommended to improve these skills to as close to PLOF as possible. 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   Speech Therapy Frequency 2x / week   Treatment/Interventions Language facilitation;Internal/external aids;SLP instruction and feedback;Functional tasks;Patient/family education;Compensatory strategies   Consulted and Agree with Plan of Care Patient      Patient will benefit from skilled therapeutic intervention in order  to improve the following deficits and impairments:   Aphasia  Frontal lobe and executive function deficit    Problem List Patient Active Problem List   Diagnosis Date Noted  . SAH (subarachnoid hemorrhage) (HCC) 01/07/2017  . Subarachnoid bleed (HCC) 01/07/2017   Rondel Baton, MS CF-SLP Speech-Language Pathologist  Arlana Lindau 02/08/2017, 11:00 AM  [facility] [address] [place], [place], [address] Phone: [phone]   Fax:  [phone]   Name: Bonnie Tran MRN: [id number] Date of Birth: [birth year]

## 2017-02-10 ENCOUNTER — Ambulatory Visit: Payer: BC Managed Care – PPO | Admitting: Occupational Therapy

## 2017-02-10 ENCOUNTER — Telehealth: Payer: Self-pay

## 2017-02-10 ENCOUNTER — Ambulatory Visit: Payer: BC Managed Care – PPO

## 2017-02-10 NOTE — Telephone Encounter (Signed)
Unable to LM to tell pt of no-show policy, as she no-showed to her 0800 appointment today with ST.

## 2017-02-15 ENCOUNTER — Ambulatory Visit: Payer: BC Managed Care – PPO | Admitting: Occupational Therapy

## 2017-02-15 ENCOUNTER — Ambulatory Visit: Payer: BC Managed Care – PPO | Attending: Neurosurgery

## 2017-02-15 ENCOUNTER — Telehealth: Payer: Self-pay | Admitting: Occupational Therapy

## 2017-02-15 DIAGNOSIS — R482 Apraxia: Secondary | ICD-10-CM | POA: Insufficient documentation

## 2017-02-15 DIAGNOSIS — R41844 Frontal lobe and executive function deficit: Secondary | ICD-10-CM | POA: Insufficient documentation

## 2017-02-15 DIAGNOSIS — R41842 Visuospatial deficit: Secondary | ICD-10-CM | POA: Insufficient documentation

## 2017-02-15 DIAGNOSIS — M6281 Muscle weakness (generalized): Secondary | ICD-10-CM | POA: Insufficient documentation

## 2017-02-15 DIAGNOSIS — R4701 Aphasia: Secondary | ICD-10-CM | POA: Insufficient documentation

## 2017-02-15 NOTE — Telephone Encounter (Signed)
Therapist attempted to contact pt via telephone and her phone was not accepting calls. Therapist left a voice mail for pt's aunt Cassie, that pt had missed her last 2 appointments and to please call us or have the pt call us.

## 2017-02-17 ENCOUNTER — Ambulatory Visit: Payer: BC Managed Care – PPO

## 2017-02-17 ENCOUNTER — Encounter (HOSPITAL_COMMUNITY): Payer: Self-pay

## 2017-02-17 ENCOUNTER — Ambulatory Visit: Payer: BC Managed Care – PPO | Admitting: Occupational Therapy

## 2017-02-17 ENCOUNTER — Emergency Department (HOSPITAL_COMMUNITY): Payer: BC Managed Care – PPO

## 2017-02-17 ENCOUNTER — Emergency Department (HOSPITAL_COMMUNITY)
Admission: EM | Admit: 2017-02-17 | Discharge: 2017-02-18 | Disposition: A | Discharge status: Home / Self Care | Payer: BC Managed Care – PPO | Attending: Emergency Medicine | Admitting: Emergency Medicine

## 2017-02-17 DIAGNOSIS — Z79899 Other long term (current) drug therapy: Secondary | ICD-10-CM | POA: Insufficient documentation

## 2017-02-17 DIAGNOSIS — R4701 Aphasia: Secondary | ICD-10-CM | POA: Diagnosis present

## 2017-02-17 DIAGNOSIS — R41842 Visuospatial deficit: Secondary | ICD-10-CM | POA: Diagnosis present

## 2017-02-17 DIAGNOSIS — R41844 Frontal lobe and executive function deficit: Secondary | ICD-10-CM

## 2017-02-17 DIAGNOSIS — M6281 Muscle weakness (generalized): Secondary | ICD-10-CM | POA: Diagnosis present

## 2017-02-17 DIAGNOSIS — R482 Apraxia: Secondary | ICD-10-CM | POA: Diagnosis present

## 2017-02-17 DIAGNOSIS — R519 Headache, unspecified: Secondary | ICD-10-CM

## 2017-02-17 DIAGNOSIS — R51 Headache: Secondary | ICD-10-CM | POA: Insufficient documentation

## 2017-02-17 LAB — CBC WITH DIFFERENTIAL/PLATELET
Basophils Absolute: 0 10*3/uL (ref 0.0–0.1)
Basophils Relative: 0 %
EOS ABS: 0.1 10*3/uL (ref 0.0–0.7)
EOS PCT: 2 %
HCT: 31.3 % — ABNORMAL LOW (ref 36.0–46.0)
Hemoglobin: 10.1 g/dL — ABNORMAL LOW (ref 12.0–15.0)
LYMPHS ABS: 1.7 10*3/uL (ref 0.7–4.0)
Lymphocytes Relative: 35 %
MCH: 27.2 pg (ref 26.0–34.0)
MCHC: 32.3 g/dL (ref 30.0–36.0)
MCV: 84.4 fL (ref 78.0–100.0)
MONO ABS: 0.4 10*3/uL (ref 0.1–1.0)
MONOS PCT: 8 %
Neutro Abs: 2.6 10*3/uL (ref 1.7–7.7)
Neutrophils Relative %: 55 %
PLATELETS: 187 10*3/uL (ref 150–400)
RBC: 3.71 MIL/uL — ABNORMAL LOW (ref 3.87–5.11)
RDW: 13.6 % (ref 11.5–15.5)
WBC: 4.8 10*3/uL (ref 4.0–10.5)

## 2017-02-17 LAB — BASIC METABOLIC PANEL
Anion gap: 6 (ref 5–15)
BUN: 7 mg/dL (ref 6–20)
CHLORIDE: 108 mmol/L (ref 101–111)
CO2: 25 mmol/L (ref 22–32)
CREATININE: 0.71 mg/dL (ref 0.44–1.00)
Calcium: 9 mg/dL (ref 8.9–10.3)
GFR calc non Af Amer: >60 mL/min (ref >60)
Glucose, Bld: 93 mg/dL (ref 65–99)
Potassium: 3.7 mmol/L (ref 3.5–5.1)
SODIUM: 139 mmol/L (ref 135–145)

## 2017-02-17 MED ORDER — SODIUM CHLORIDE 0.9 % IV BOLUS (SEPSIS)
500.0000 mL | Freq: Once | INTRAVENOUS | Status: AC
  Administered 2017-02-17: 500 mL via INTRAVENOUS

## 2017-02-17 MED ORDER — DIPHENHYDRAMINE HCL 50 MG/ML IJ SOLN
25.0000 mg | Freq: Once | INTRAMUSCULAR | Status: AC
  Administered 2017-02-17: 25 mg via INTRAVENOUS
  Filled 2017-02-17: qty 1

## 2017-02-17 MED ORDER — DEXTROSE 5 % IV SOLN
500.0000 mg | Freq: Once | INTRAVENOUS | Status: AC
Start: 2017-02-17 — End: 2017-02-18
  Administered 2017-02-18: 500 mg via INTRAVENOUS
  Filled 2017-02-17: qty 5

## 2017-02-17 MED ORDER — BUTALBITAL-APAP-CAFFEINE 50-325-40 MG PO TABS
2.0000 | ORAL_TABLET | Freq: Once | ORAL | Status: AC
  Administered 2017-02-17: 2 via ORAL
  Filled 2017-02-17: qty 2

## 2017-02-17 MED ORDER — METOCLOPRAMIDE HCL 5 MG/ML IJ SOLN
10.0000 mg | Freq: Once | INTRAMUSCULAR | Status: AC
  Administered 2017-02-17: 10 mg via INTRAVENOUS
  Filled 2017-02-17: qty 2

## 2017-02-17 MED ORDER — IOPAMIDOL (ISOVUE-370) INJECTION 76%
INTRAVENOUS | Status: AC
  Administered 2017-02-17: 50 mL
  Filled 2017-02-17: qty 50

## 2017-02-17 NOTE — Therapy (Signed)
[facility] [address] [place], [place], [address] Phone: [phone]   Fax:  [phone]  Speech Language Pathology Treatment  Patient Details  Name: Bonnie Tran MRN: [id number] Date of Birth: 1976-07-27 Referring Provider: Maeola Harman, MD  Encounter Date: 02/17/2017      End of Session - 02/17/17 1318    Visit Number 5   Number of Visits 25   SLP Start Time 0810  pt 8 minutes late   SLP Stop Time  0845   SLP Time Calculation (min) 35 min      No past medical history on file.  Past Surgical History:  Procedure Laterality Date  . CRANIOTOMY Left 01/07/2017   Procedure: CRANIOTOMY INTRACRANIAL ANEURYSM FOR CAROTID;  Surgeon: Lisbeth Renshaw, MD;  Location: [facility] OR;  Service: Neurosurgery;  Laterality: Left;  . IR GENERIC HISTORICAL  01/07/2017   IR ANGIO INTRA EXTRACRAN SEL INTERNAL CAROTID BILAT MOD SED 01/07/2017 Lisbeth Renshaw, MD [facility]-INTERV RAD  . IR GENERIC HISTORICAL  01/07/2017   IR ANGIO VERTEBRAL SEL VERTEBRAL BILAT MOD SED 01/07/2017 Lisbeth Renshaw, MD [facility]INTER[facility]  . RADIOLOGY WITH ANESTHESIA N/A 01/07/2017   Procedure: RADIOLOGY WITH ANESTHESIA;  Surgeon: Medication Radiologist, MD;  Location: [facility] OR;  Service: Radiology;  Laterality: N/A;    There were no vitals filed for this visit.             ADULT SLP TREATMENT - 02/17/17 1311      General Information   Behavior/Cognition Alert;Cooperative;Pleasant mood     Treatment Provided   Treatment provided Cognitive-Linquistic     Pain Assessment   Pain Assessment 0-10   Pain Score 7    Pain Location front of head radiating down neck   Pain Descriptors / Indicators Headache;Pressure;Radiating     Cognitive-Linquistic Treatment   Treatment focused on Aphasia   Skilled Treatment SLP and pt engaged in mod-copmlex/complex conversation for 20 minutes about her care with PCP and pt had three observable episodes of anomia, for which she  admitted using the synonym strategy. Description tasks were completed with 2 anomic episodes in 12 minutes- pt admittedly used synonym strategy, and did so successfully.      Assessment / Recommendations / Plan   Plan Continue with current plan of care            SLP Short Term Goals - 02/17/17 1319      SLP SHORT TERM GOAL #1   Title pt will verbally complete mod complex description tasks 85% with rare min A   Time 2   Period Weeks   Status On-going     SLP SHORT TERM GOAL #2   Title pt will complete mod complex verbal tasks with 85% success and occasional min A   Time 2   Period Weeks   Status On-going     SLP SHORT TERM GOAL #3   Title pt will demonstrate ability to write simple one word lists 90% with error awareness/self correction   Time 2   Period Weeks   Status On-going     SLP SHORT TERM GOAL #4   Title pt will undergo reading assessment post SAH in next 1-2 visits   Status Achieved          SLP Long Term Goals - 02/17/17 1319      SLP LONG TERM GOAL #1   Title pt will complete 20 minutes mod complex/complex verbal linguistic tasks or mod complex/complex conversation with rare min A  Time 6   Period Weeks   Status On-going     SLP LONG TERM GOAL #2   Title pt will complete 20 minutes complex verbal linguistic tasks or complex conversation with modified independence   Time 10   Period Weeks   Status On-going     SLP LONG TERM GOAL #3   Title pt will demo ability to write/type functional professional emails with rare min A   Time 7   Period Weeks   Status On-going     SLP LONG TERM GOAL #4   Title pt will demo ability to functionally write/type professional emails re: complex topics with modified independence   Time 10   Period Weeks   Status On-going     SLP LONG TERM GOAL #5   Title pt will demo understanding of 5-7-sentences of text from a practical source (email, news article, etc)   Time 10   Period Weeks   Status On-going           Plan - 02/17/17 1318    Clinical Impression Statement Pt cont to present with expressive aphasia with complaints of written aphasia and as receptive language (reading) deficits. Pt cont to appear highly motivated to improve.  Skilled ST is cont'd to be recommended to improve these skills to as close to PLOF as possible. 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   Speech Therapy Frequency 2x / week   Treatment/Interventions Language facilitation;Internal/external aids;SLP instruction and feedback;Functional tasks;Patient/family education;Compensatory strategies   Consulted and Agree with Plan of Care Patient      Patient will benefit from skilled therapeutic intervention in order to improve the following deficits and impairments:   Aphasia    Problem List Patient Active Problem List   Diagnosis Date Noted  . SAH (subarachnoid hemorrhage) (HCC) 01/07/2017  . Subarachnoid bleed (HCC) 01/07/2017    [facility] CenterCHINKE,CARL ,MS, CCC-SLP  02/17/2017, 1:21 PM  [facility] [address] [place], [place], [address] Phone: [phone]   Fax:  [phone]   Name: Bonnie Tran MRN: [id number] Date of Birth: [birth year]

## 2017-02-17 NOTE — Patient Instructions (Signed)
Dr. Conchita ParisNundkumar,   Bonnie Tran is receiving ST and OT at our site. She continues to demonstrate significant balance deficits and can benefit from PT. If you agree please sign and return the attached referral for PT.  She is experiencing significant headache pain which she is unable to manage with over the counter medications. It is limiting her performance of daily activities.  If you are Tran to assist with this it would improve her ability to participate in therapy and daily activities.  Please feel free to contact me with any questions.  Sincerely, Keene BreathKathryn Rine, OTR/L

## 2017-02-17 NOTE — ED Triage Notes (Signed)
Pt had brain aneurysm in May. She began having severe pain in her head today. She has taken percocet at home without relief. Pt alert and oriented but tearful in triage.

## 2017-02-17 NOTE — Therapy (Signed)
[facility] [address] [place], [place], [address] Phone: [phone]   Fax:  [phone]  Occupational Therapy Treatment  Patient Details  Name: Bonnie Tran MRN: [id number] Date of Birth: Jun 23, 1976 Referring Provider: Dr. Venetia MaxonStern  Encounter Date: 02/17/2017      OT End of Session - 02/17/17 1133    Visit Number 5   Number of Visits 25   Date for OT Re-Evaluation 04/27/17   Authorization Type BC[employer]   OT Start Time 0848   OT Stop Time 0930   OT Time Calculation (min) 42 min   Activity Tolerance Patient limited by pain   Behavior During Therapy Anxious      No past medical history on file.  Past Surgical History:  Procedure Laterality Date  . CRANIOTOMY Left 01/07/2017   Procedure: CRANIOTOMY INTRACRANIAL ANEURYSM FOR CAROTID;  Surgeon: Lisbeth RenshawNeelesh Nundkumar, MD;  Location: [facility] OR;  Service: Neurosurgery;  Laterality: Left;  . IR GENERIC HISTORICAL  01/07/2017   IR ANGIO INTRA EXTRACRAN SEL INTERNAL CAROTID BILAT MOD SED 01/07/2017 Lisbeth RenshawNeelesh Nundkumar, MD [facility]INTER[facility]  . IR GENERIC HISTORICAL  01/07/2017   IR ANGIO VERTEBRAL SEL VERTEBRAL BILAT MOD SED 01/07/2017 Lisbeth RenshawNeelesh Nundkumar, MD [facility]  . RADIOLOGY WITH ANESTHESIA N/A 01/07/2017   Procedure: RADIOLOGY WITH ANESTHESIA;  Surgeon: Medication Radiologist, MD;  Location: [facility] OR;  Service: Radiology;  Laterality: N/A;    There were no vitals filed for this visit.      Subjective Assessment - 02/17/17 0907    Subjective  Pt reports she has called MD regarding headaches   Pertinent History Pt s/p subarachnoid hemorrhage after lifting weights at the gym, ADD   Patient Stated Goals to get back to normal   Currently in Pain? Yes                  Treatment: Organization task with pt organizing breakfast for 14 people  Within a specific budget providing a variety of food and beverage options.  Pt completed correctly with increased time required. Discussion  with pt and ST that pt may benefit from a referral to [facility]. Therapist sent note and PT referral request to Dr. Conchita ParisNundkumar, see pt instructions, therapist faxed.            OT Education - 02/17/17 [phone]    Education provided Yes   Education Details recommendation that pt does not drive yet due to visiual deficits and risk for injury as pt is limited by headaches which may impact her ability to safely drive, PT referral is being resent to Dr. Jacqulynn CadetNundkumar   Person(s) Educated Patient   Methods Explanation;Demonstration;Verbal cues   Comprehension Verbalized understanding          OT Short Term Goals - 02/03/17 1527      OT SHORT TERM GOAL #1   Title I with HEP-03/16/17   Time 6   Period Weeks   Status New     OT SHORT TERM GOAL #2   Title Pt will verbalize understanding  of compensatory strategies for cognitive/ visual deficits.   Time 6   Period Weeks   Status New     OT SHORT TERM GOAL #3   Title Pt will demonstrate ability to perform mod complex organization/ problem solving task with 95% or better accuracy.   Time 6   Period Weeks   Status New     OT SHORT TERM GOAL #4   Title Pt will perform  tabletop scanning with 90% accuracy.   Time 6   Period Weeks   Status New     OT SHORT TERM GOAL #5   Title Pt will perform basic cooking modidifed independently demonstrating good safety awareness.   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   Time 12   Period Weeks    Status New     OT LONG TERM GOAL #3   Title Pt will perform environmental scanning with 95% accuracy.   Time 12   Period Weeks   Status New     OT LONG TERM GOAL #4   Title Pt will resume mod complex home management and cooking at a modified independent level.   Time 12   Period Weeks   Status New     OT LONG TERM GOAL #5   Title Pt will demonstrate improved standing balance for ADLS/IADLS as evidenced by increasing bilateral standing functional reach test to 10 inches or greater   Baseline RUE 6 inches, LUE 7 inches   Time 12   Period Weeks   Status New               Plan - 02/17/17 1131    Clinical Impression Statement Pt reports significant headaches limiting her ability to perform daily activities. Therapist reviewed cancellation policy with pt as she no showed for her last 2 appointments.   Rehab Potential Good   Clinical Impairments Affecting Rehab Potential decreased awareness of deficits   OT Frequency 2x / week   OT Duration 12 weeks   OT Treatment/Interventions Self-care/ADL training;Moist Heat;Fluidtherapy;DME and/or AE instruction;Patient/family education;Balance training;Therapeutic exercises;Ultrasound;Therapeutic exercise;Therapeutic activities;Cognitive remediation/compensation;Neuromuscular education;Cryotherapy;Energy conservation;Manual Therapy;Visual/perceptual remediation/compensation   Plan check to see if PT order received   Consulted and Agree with Plan of Care Patient      Patient will benefit from skilled therapeutic intervention in order to improve the following deficits and impairments:  Decreased cognition, Impaired vision/preception, Decreased activity tolerance, Decreased endurance, Decreased strength, Impaired UE functional use, Decreased safety awareness, Decreased knowledge of precautions, Decreased balance  Visit Diagnosis: Visuospatial deficit  Frontal lobe and executive function deficit    Problem List Patient Active Problem  List   Diagnosis Date Noted  . SAH (subarachnoid hemorrhage) (HCC) 01/07/2017  . Subarachnoid bleed (HCC) 01/07/2017    RINE,KATHRYN 02/17/2017, 11:36 AM  [facility] [address] [place], [place], [address] Phone: [phone]   Fax:  [phone]  Name: Bonnie Tran MRN: [id number] Date of Birth: 01-26-1976

## 2017-02-17 NOTE — ED Notes (Signed)
Gave pt saltine crackers and Ginger Ale, per [employer] Char[employer] fundraiserN.

## 2017-02-17 NOTE — ED Notes (Signed)
Patient transported to CT 

## 2017-02-17 NOTE — ED Provider Notes (Addendum)
[facility]-EMER[facility]ENCY DEPT Provider Note   CSN: [id number] Arrival date & time: 02/17/17  1738     History   Chief Complaint Chief Complaint  Patient presents with  . Headache    HPI Bonnie Tran is a 41 y.o. female.  The history is provided by the patient. No language interpreter was used.   Bonnie Tran is a 41 y.o. female who presents to the Emergency Department complaining of HA.  She presents for evaluation of headache that began at 7:30 this morning when she awoke. The headache is diffuse but greater on the left side of her head results and pressure on her eyes. She has associated photophobia. She also reports neck pain and neck stiffness. 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 She states this pain is not as bad as her headache from the ruptured aneurysm. She does not routinely get headaches. No fevers, nausea, vomiting, numbness, weakness. History reviewed. No pertinent past medical history.  Patient Active Problem List   Diagnosis Date Noted  . SAH (subarachnoid hemorrhage) (HCC) 01/07/2017  . Subarachnoid bleed (HCC) 01/07/2017    Past Surgical History:  Procedure Laterality Date  . CRANIOTOMY Left 01/07/2017   Procedure: CRANIOTOMY INTRACRANIAL ANEURYSM FOR CAROTID;  Surgeon: Lisbeth Renshaw, MD;  Location: [facility] OR;  Service: Neurosurgery;  Laterality: Left;  . IR GENERIC HISTORICAL  01/07/2017   IR ANGIO INTRA EXTRACRAN SEL INTERNAL CAROTID BILAT MOD SED 01/07/2017 Lisbeth Renshaw, MD [facility]-INTER[facility]  . IR GENERIC HISTORICAL  01/07/2017   IR ANGIO VERTEBRAL SEL VERTEBRAL BILAT MOD SED 01/07/2017 Lisbeth Renshaw, MD [facility]  . RADIOLOGY WITH ANESTHESIA N/A 01/07/2017   Procedure: RADIOLOGY WITH ANESTHESIA;  Surgeon: Medication Radiologist, MD;  Location: [facility] OR;  Service: Radiology;  Laterality: N/A;    OB History    No data available       Home Medications    Prior to Admission medications   Medication Sig Start Date End Date Taking?  Authorizing Provider  acetaminophen (TYLENOL) 500 MG tablet Take 1,000 mg by mouth every 6 (six) hours as needed for moderate pain or headache.   Yes Historical Provider, MD  amphetamine-dextroamphetamine (ADDERALL) 10 MG tablet Take 10 mg by mouth 2 (two) times daily with a meal.   Yes Historical Provider, MD  citalopram (CELEXA) 10 MG tablet Take 10 mg by mouth daily.   Yes Historical Provider, MD  clonazePAM (KLONOPIN) 1 MG tablet Take 0.5-1 mg by mouth daily as needed for anxiety.    Yes Historical Provider, MD  levETIRAcetam (KEPPRA) 500 MG tablet Take 1 tablet (500 mg total) by mouth every 12 (twelve) hours. 01/21/17  Yes Maeola Harman, MD  oxyCODONE-acetaminophen (PERCOCET/ROXICET) 5-325 MG tablet Take 1 tablet by mouth every 6 (six) hours as needed for severe pain.   Yes Historical Provider, MD  butalbital-acetaminophen-caffeine (FIORICET, ESGIC) [phone] MG tablet Take 1-2 tablets by mouth every 6 (six) hours as needed for headache. 02/18/17 02/18/18  Tilden Fossa, MD  HYDROcodone-acetaminophen (NORCO/VICODIN) 5-325 MG tablet Take 1 tablet by mouth every 4 (four) hours as needed for moderate pain. Patient not taking: Reported on 02/17/2017 01/21/17   Maeola Harman, MD  niMODipine (NIMOTOP) 30 MG capsule Take 2 capsules (60 mg total) by mouth every 4 (four) hours. Patient not taking: Reported on 02/17/2017 01/21/17   Maeola Harman, MD    Family History History reviewed. No pertinent family history.  Social History Social History  Substance Use Topics  . Smoking status: Never Smoker  .  Smokeless tobacco: Never Used  . Alcohol use No     Allergies   Patient has no known allergies.   Review of Systems Review of Systems  All other systems reviewed and are negative.    Physical Exam Updated Vital Signs BP 94/62   Pulse 64   Temp 98.5 F (36.9 C) (Oral)   Resp 13   LMP 01/24/2017 (Within Days)   SpO2 97%   Physical Exam  Constitutional: She is oriented to person, place, and time.  She appears well-developed and well-nourished.  HENT:  Head: Normocephalic and atraumatic.  Eyes: EOM are normal. Pupils are equal, round, and reactive to light.  Photophobia  Cardiovascular: Normal rate and regular rhythm.   No murmur heard. Pulmonary/Chest: Effort normal and breath sounds normal. No respiratory distress.  Abdominal: Soft. There is no tenderness. There is no rebound and no guarding.  Musculoskeletal: She exhibits no edema or tenderness.  Neurological: She is alert and oriented to person, place, and time. No cranial nerve deficit. Coordination normal.  5 out of 5 strength in all 4 extremities with sensation to light touch intact in all 4 extremities  Skin: Skin is warm and dry.  Psychiatric: She has a normal mood and affect. Her behavior is normal.  Nursing note and vitals reviewed.    ED Treatments / Results  Labs (all labs ordered are listed, but only abnormal results are displayed) Labs Reviewed  CBC WITH DIFFERENTIAL/PLATELET - Abnormal; Notable for the following:       Result Value   RBC 3.71 (*)    Hemoglobin 10.1 (*)    HCT 31.3 (*)    All other components within normal limits  BASIC METABOLIC PANEL    EKG  EKG Interpretation None       Radiology Ct Angio Head W Or Wo Contrast  Result Date: 02/17/2017 CLINICAL DATA:  41 y/o F; severe head pain. History of aneurysm. 01/07/2017 craniotomy for aneurysm repair. EXAM: CT ANGIOGRAPHY HEAD TECHNIQUE: Multidetector CT imaging of the head was performed using the standard protocol during bolus administration of intravenous contrast. Multiplanar CT image reconstructions and MIPs were obtained to evaluate the vascular anatomy. CONTRAST:  50 cc Isovue 370 COMPARISON:  01/07/2017 cerebral angiogram. FINDINGS: CT HEAD Brain: No evidence of acute infarction, hemorrhage, hydrocephalus, extra-axial collection or mass lesion/mass effect. Vascular: As below. Skull: Postsurgical changes related to left frontal craniotomy  with a thin subjacent extra-axial fluid collection without significant mass effect on the brain. Sinuses: Imaged portions are clear. Orbits: No acute finding. CTA HEAD Anterior circulation: No significant stenosis, proximal occlusion, aneurysm, or vascular malformation. 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 No recurrent aneurysm identified. Posterior circulation: No significant stenosis, proximal occlusion, aneurysm, or vascular malformation. Venous sinuses: As permitted by contrast timing, patent. Anatomic variants: Patent small posterior communicating arteries and anterior communicating artery. Delayed phase: No abnormal intracranial enhancement. IMPRESSION: 1. No evidence for large territory infarct, acute intracranial hemorrhage, or focal mass effect. 2. Postsurgical changes related to left frontal craniotomy. Thin low-attenuation subjacent extra-axial collection without mass effect on the brain. 3. 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 No recurrence identified. 4. Otherwise unremarkable CTA of the head. No significant stenosis, proximal occlusion, new aneurysm, or vascular malformation identified. Electronically Signed   By: Mitzi HansenLance  Furusawa-Stratton M.D.   On: 02/17/2017 21:02    Procedures Procedures (including critical care time)  Medications Ordered in ED Medications  valproate (DEPACON) 500 mg in dextrose 5 % 50 mL  IVPB (500 mg Intravenous New Bag/Given 02/18/17 0007)  metoCLOPramide (REGLAN) injection 10 mg (10 mg Intravenous Given 02/17/17 1850)  diphenhydrAMINE (BENADRYL) injection 25 mg (25 mg Intravenous Given 02/17/17 1853)  sodium chloride 0.9 % bolus 500 mL (0 mLs Intravenous Stopped 02/17/17 1956)  iopamidol (ISOVUE-370) 76 % injection (50 mLs  Contrast Given 02/17/17 2006)  butalbital-acetaminophen-caffeine (FIORICET, ESGIC) 50-325-40 MG per tablet 2 tablet (2 tablets Oral Given 02/17/17 2336)     Initial Impression / Assessment and Plan /  ED Course  I have reviewed the triage vital signs and the nursing notes.  Pertinent labs & imaging results that were available during my care of the patient were reviewed by me and considered in my medical decision making (see chart for details).   patient with history of subarachnoid hemorrhage status post aneurysm clipping here with headache, neck pain. Patient had significant pain on ED arrival with nonfocal neurologic examination. She is partially improved after treatment with headache cocktail although she does have some persistent headache. Imaging is negative for acute abnormality or recurrent aneurysm or bleed. Discussed the patient recommendation for lumbar puncture to rule out subclinical bleed and patient declined lumbar puncture. Discussed with Dr. Yetta Barre with neurosurgery requested of patient. Plan to provide additional medications to treat symptoms with outpatient follow up and return precautions.    On repeat assessment following additional meds pt is feeling improved.    Final Clinical Impressions(s) / ED Diagnoses   Final diagnoses:  Bad headache    New Prescriptions New Prescriptions   BUTALBITAL-ACETAMINOPHEN-CAFFEINE (FIORICET, ESGIC) 50-325-40 MG TABLET    Take 1-2 tablets by mouth every 6 (six) hours as needed for headache.     Tilden Fossa, MD 02/18/17 4098    Tilden Fossa, MD 02/18/17 [phone]

## 2017-02-18 MED ORDER — BUTALBITAL-APAP-CAFFEINE 50-325-40 MG PO TABS: 1.0000 | ORAL_TABLET | Freq: Four times a day (QID) | ORAL | 0 refills | Status: AC | PRN

## 2017-02-18 NOTE — ED Notes (Signed)
Pt stable, ambulatory, states understanding of discharge instructions 

## 2017-02-22 ENCOUNTER — Ambulatory Visit: Payer: BC Managed Care – PPO | Admitting: *Deleted

## 2017-02-22 ENCOUNTER — Ambulatory Visit: Payer: BC Managed Care – PPO | Admitting: Occupational Therapy

## 2017-02-23 ENCOUNTER — Ambulatory Visit: Payer: BC Managed Care – PPO

## 2017-02-23 ENCOUNTER — Ambulatory Visit: Payer: BC Managed Care – PPO | Admitting: Occupational Therapy

## 2017-02-23 DIAGNOSIS — M6281 Muscle weakness (generalized): Secondary | ICD-10-CM

## 2017-02-23 DIAGNOSIS — R4701 Aphasia: Secondary | ICD-10-CM | POA: Diagnosis not present

## 2017-02-23 DIAGNOSIS — R41842 Visuospatial deficit: Secondary | ICD-10-CM

## 2017-02-23 DIAGNOSIS — R41844 Frontal lobe and executive function deficit: Secondary | ICD-10-CM

## 2017-02-23 NOTE — Therapy (Signed)
[facility] [address] [place], [place], [address] Phone: [phone]   Fax:  [phone]  Occupational Therapy Treatment  Patient Details  Name: Bonnie Tran MRN: [id number] Date of Birth: [birth year] Referring Provider: Dr. Venetia MaxonStern  Encounter Date: 02/23/2017      OT End of Session - 02/23/17 1303    Visit Number 6   Number of Visits 25   Authorization Type BCBS   OT Start Time 1028  pt late   OT Stop Time 1100   OT Time Calculation (min) 32 min   Activity Tolerance Patient tolerated treatment well   Behavior [facility] for tasks assessed/performed      No past medical history on file.  Past Surgical History:  Procedure Laterality Date  . CRANIOTOMY Left 01/07/2017   Procedure: CRANIOTOMY INTRACRANIAL ANEURYSM FOR CAROTID;  Surgeon: Lisbeth RenshawNeelesh Nundkumar, MD;  [facility];  Service: Neurosurgery;  Laterality: Left;  . IR GENERIC HISTORICAL  01/07/2017   IR ANGIO INTRA EXTRACRAN SEL INTERNAL CAROTID BILAT MOD SED 01/07/2017 Lisbeth RenshawNeelesh Nundkumar, MD [facility]-INTER[facility]  . IR GENERIC HISTORICAL  01/07/2017   IR ANGIO VERTEBRAL SEL VERTEBRAL BILAT MOD SED 01/07/2017 Lisbeth RenshawNeelesh Nundkumar, MD [facility]  . RADIOLOGY WITH ANESTHESIA N/A 01/07/2017   Procedure: RADIOLOGY WITH ANESTHESIA;  Surgeon: Medication Radiologist, MD;  [facility];  Service: Radiology;  Laterality: N/A;    There were no vitals filed for this visit.      Subjective Assessment - 02/23/17 1031    Subjective  Headache today   Pertinent History Pt s/p subarachnoid hemorrhage after lifting weights at the gym, ADD   Patient Stated Goals to get back to normal   Currently in Pain? Yes   Pain Score 2    Pain Location Head   Pain Descriptors / Indicators Aching   Pain Type Acute pain   Pain Onset 1 to 4 weeks ago   Pain Frequency Constant   Aggravating Factors  auditiory stimulation   Pain Relieving Factors meds   Multiple Pain Sites No            Pt reports seeing a neurologist in [place]. Therapist called Dr. Raynaldo OpitzIsaac's office and requested PT order. 1.5 M number cancellation with a cognitive component with 50% accuracy to locate repeating number. Functional problem solving dealing with time/ calendar, 70% accurate.                    OT Short Term Goals - 02/23/17 1034      OT SHORT TERM GOAL #1   Title I with HEP-03/16/17   Time 6   Period Weeks   Status New     OT SHORT TERM GOAL #2   Title Pt will verbalize understanding  of compensatory strategies for cognitive/ visual deficits.   Time 6   Period Weeks   Status New     OT SHORT TERM GOAL #3   Title Pt will demonstrate ability to perform mod complex organization/ problem solving task with 95% or better accuracy.   Time 6   Period Weeks   Status New     OT SHORT TERM GOAL #4   Title Pt will perform tabletop scanning with 90% accuracy.   Time 6   Period Weeks   Status New     OT SHORT TERM GOAL #5   Title Pt will perform basic cooking modified independently demonstrating good safety awareness.   Time 6   Period Weeks  Status New     OT SHORT TERM GOAL #6   Title Pt will report performing home management in standing x 45 mins without rest break   Time 6   Period Weeks   Status New           OT Long Term Goals - 02/03/17 1536      OT LONG TERM GOAL #1   Title Pt will perfom simulated work activities/ school activities with 95% accuracy.- due 04/27/17   Time 12   Period Weeks   Status New     OT LONG TERM GOAL #2   Title Pt will perform a physical and cognitive task simultaneously with 90 % accuracy.   Time 12   Period Weeks   Status New     OT LONG TERM GOAL #3   Title Pt will perform environmental scanning with 95% accuracy.   Time 12   Period Weeks   Status New     OT LONG TERM GOAL #4   Title Pt will resume mod complex home management and cooking at a modified independent level.   Time 12   Period Weeks   Status  New     OT LONG TERM GOAL #5   Title Pt will demonstrate improved standing balance for ADLS/IADLS as evidenced by increasing bilateral standing functional reach test to 10 inches or greater   Baseline RUE 6 inches, LUE 7 inches   Time 12   Period Weeks   Status New             Patient will benefit from skilled therapeutic intervention in order to improve the following deficits and impairments:     Visit Diagnosis: Visuospatial deficit  Frontal lobe and executive function deficit  Muscle weakness (generalized)    Problem List Patient Active Problem List   Diagnosis Date Noted  . SAH (subarachnoid hemorrhage) (HCC) 01/07/2017  . Subarachnoid bleed (HCC) 01/07/2017    Bonnie Tran 02/23/2017, 1:04 PM  [facility] [address] [place], [place], [address] Phone: [phone]   Fax:  [phone]  Name: Bonnie Tran MRN: [id number] Date of Birth: 1976/06/21

## 2017-02-23 NOTE — Patient Instructions (Signed)
   Appointment with Dr. Faith RogueZachary Swartz (Rehab MD, pain management MD)  June 6th, 2018 at [facility]10AM

## 2017-02-24 NOTE — Therapy (Signed)
[facility] [address] [place], [place], [address] Phone: [phone]   Fax:  [phone]  Speech Language Pathology Treatment  Patient Details  Name: Bonnie Tran MRN: [id number] Date of Birth: [birth year] Referring Provider: Maeola Harman, MD  Encounter Date: 02/23/2017      End of Session - 02/24/17 1707    Visit Number 6   Number of Visits 25   SLP Start Time 1105   SLP Stop Time  1145   SLP Time Calculation (min) 40 min   Activity Tolerance Patient tolerated treatment well      No past medical history on file.  Past Surgical History:  Procedure Laterality Date  . CRANIOTOMY Left 01/07/2017   Procedure: CRANIOTOMY INTRACRANIAL ANEURYSM FOR CAROTID;  Surgeon: Lisbeth Renshaw, MD;  [facility];  Service: Neurosurgery;  Laterality: Left;  . IR GENERIC HISTORICAL  01/07/2017   IR ANGIO INTRA EXTRACRAN SEL INTERNAL CAROTID BILAT MOD SED 01/07/2017 Lisbeth Renshaw, MD [facility]  . IR GENERIC HISTORICAL  01/07/2017   IR ANGIO VERTEBRAL SEL VERTEBRAL BILAT MOD SED 01/07/2017 Lisbeth Renshaw, MD [facility]  . RADIOLOGY WITH ANESTHESIA N/A 01/07/2017   Procedure: RADIOLOGY WITH ANESTHESIA;  Surgeon: Medication Radiologist, MD;  [facility];  Service: Radiology;  Laterality: N/A;    There were no vitals filed for this visit.      Subjective Assessment - 02/23/17 1126    Subjective Pt unsure of meds - pt has a med list but did not bring it today. Meds changed at neurologist yesterday.   Currently in Pain? Yes   Pain Score 2    Pain Location Head   Pain Orientation Upper;Left   Pain Descriptors / Indicators Aching   Pain Type Acute pain   Pain Frequency Constant   Aggravating Factors  noise   Pain Relieving Factors meds               ADULT SLP TREATMENT - 02/23/17 1128      General Information   Behavior/Cognition Alert;Cooperative;Pleasant mood     Treatment Provided   Treatment  provided Cognitive-Linquistic     Cognitive-Linquistic Treatment   Treatment focused on Aphasia   Skilled Treatment Pt with oververbosity today - stated that part of it was that she felt comfortable and she had a lot to tell SLP (neurologist appt, med change, ED visit). Anomia noted in mod complex/complex conversation x3 in 30 minutes. In detailed description pt had dysnomia x2 in 3 descriptions. Pt attempted to use synonym strategy but was unsuccessful x1 and successful x1.     Assessment / Recommendations / Plan   Plan Continue with current plan of care     Progression Toward Goals   Progression toward goals Progressing toward goals            SLP Short Term Goals - 02/24/17 1708      SLP SHORT TERM GOAL #1   Title pt will verbally complete mod complex description tasks 85% with rare min A   Time 1   Period Weeks   Status On-going     SLP SHORT TERM GOAL #2   Title pt will complete mod complex verbal tasks with 85% success and occasional min A   Status Achieved     SLP SHORT TERM GOAL #3   Title pt will demonstrate ability to write simple one word lists 90% with error awareness/self correction   Status Achieved     SLP SHORT TERM  GOAL #4   Title pt will undergo reading assessment post SAH in next 1-2 visits   Status Achieved          SLP Long Term Goals - 02/24/17 1709      SLP LONG TERM GOAL #1   Title pt will complete 20 minutes mod complex/complex verbal linguistic tasks or mod complex/complex conversation with rare min A    Status Achieved     SLP LONG TERM GOAL #2   Title pt will complete 20 minutes complex verbal linguistic tasks or complex conversation with modified independence   Time 9   Period Weeks   Status On-going     SLP LONG TERM GOAL #3   Title pt will demo ability to write/type functional professional emails with rare min A   Time 6   Period Weeks   Status On-going     SLP LONG TERM GOAL #4   Title pt will demo ability to functionally  write/type professional emails re: complex topics with modified independence   Time 9   Period Weeks   Status On-going     SLP LONG TERM GOAL #5   Title pt will demo understanding of 5-7-sentences of text from a practical source (email, news article, etc)   Time 9   Period Weeks   Status On-going          Plan - 02/24/17 1707    Clinical Impression Statement Pt cont to present with expressive aphasia with complaints of written aphasia and as receptive language (reading) deficits. Pt cont to appear highly motivated to improve.  Skilled ST is cont'd to be recommended to improve these skills to as close to PLOF as possible. 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   Speech Therapy Frequency 2x / week   Duration --  8 weeks   Treatment/Interventions Language facilitation;Internal/external aids;SLP instruction and feedback;Functional tasks;Patient/family education;Compensatory strategies   Potential to Achieve Goals Good      Patient will benefit from skilled therapeutic intervention in order to improve the following deficits and impairments:   Aphasia    Problem List Patient Active Problem List   Diagnosis Date Noted  . SAH (subarachnoid hemorrhage) (HCC) 01/07/2017  . Subarachnoid bleed (HCC) 01/07/2017    [facility] MountainCHINKE,CARL ,MS, CCC-SLP  02/24/2017, 5:10 PM  [facility] [address] [place], [place], [address] Phone: [phone]   Fax:  [phone]   Name: Bonnie Tran MRN: [id number] Date of Birth: 1976-09-01

## 2017-03-01 ENCOUNTER — Ambulatory Visit: Payer: BC Managed Care – PPO

## 2017-03-01 ENCOUNTER — Ambulatory Visit: Payer: BC Managed Care – PPO | Admitting: Occupational Therapy

## 2017-03-03 ENCOUNTER — Ambulatory Visit: Payer: BC Managed Care – PPO

## 2017-03-03 ENCOUNTER — Ambulatory Visit: Payer: BC Managed Care – PPO | Admitting: Occupational Therapy

## 2017-03-03 DIAGNOSIS — R41844 Frontal lobe and executive function deficit: Secondary | ICD-10-CM

## 2017-03-03 DIAGNOSIS — R41842 Visuospatial deficit: Secondary | ICD-10-CM

## 2017-03-03 DIAGNOSIS — R4701 Aphasia: Secondary | ICD-10-CM

## 2017-03-03 DIAGNOSIS — M6281 Muscle weakness (generalized): Secondary | ICD-10-CM

## 2017-03-03 NOTE — Therapy (Signed)
[employer] [address] [place], [place], [address] Phone: [phone]   Fax:  [phone]  Speech Language Pathology Treatment  Patient Details  Name: Bonnie Tran MRN: [id number] Date of Birth: [birth year] Referring Provider: Erline Levine, MD  Encounter Date: 03/03/2017      End of Session - 03/03/17 0958    Visit Number 7   Number of Visits 25   SLP Start Time 5625  pt 5 minutes late   SLP Stop Time  0932   SLP Time Calculation (min) 41 min   Activity Tolerance Patient tolerated treatment well      No past medical history on file.  Past Surgical History:  Procedure Laterality Date  . CRANIOTOMY Left 01/07/2017   Procedure: CRANIOTOMY INTRACRANIAL ANEURYSM FOR CAROTID;  Surgeon: Consuella Lose, MD;  Location: [place];  Service: Neurosurgery;  Laterality: Left;  . IR GENERIC HISTORICAL  01/07/2017   IR ANGIO INTRA EXTRACRAN SEL INTERNAL CAROTID BILAT MOD SED 01/07/2017 Consuella Lose, MD [facility]  . IR GENERIC HISTORICAL  01/07/2017   IR ANGIO VERTEBRAL SEL VERTEBRAL BILAT MOD SED 01/07/2017 Consuella Lose, MD [facility]  . RADIOLOGY WITH ANESTHESIA N/A 01/07/2017   Procedure: RADIOLOGY WITH ANESTHESIA;  Surgeon: Medication Radiologist, MD;  Location: [place];  Service: Radiology;  Laterality: N/A;    There were no vitals filed for this visit.      Subjective Assessment - 03/03/17 0855    Currently in Pain? Yes   Pain Score 2    Pain Location Neck   Pain Orientation Left;Upper   Pain Descriptors / Indicators Aching;Throbbing   Pain Type Acute pain   Pain Onset 1 to 4 weeks ago   Pain Frequency Constant   Aggravating Factors  sleeping on left side, turning head to lt side   Pain Relieving Factors sleeping, forgetting about it               ADULT SLP TREATMENT - 03/03/17 0857      General Information   Behavior/Cognition Alert;Cooperative;Pleasant mood     Treatment Provided   Treatment provided Cognitive-Linquistic     Cognitive-Linquistic Treatment   Treatment focused on Aphasia   Skilled Treatment Pt tells SLP the more she is around certain people the better her speech becomes because she is more comfortable - if she's more nervous she has more incidences of anomia. In the first 25 minutes of mod complex/complex conversation pt without anomia. One instance in the session today. Becuase pt has complained of writing issues in the past with anomia, SLP had pt write out definitions for less common objects. Pt req'd much extended time and had two episodes of anomia in approx 5 minutes. SLP inquired how emails had been going at work and pt stated she is keeping things as brief and as simple with her language as she can. She re-reads emails 5-6 times before sending them out.  SLP briefly gave A with suggesting simple ways to proof emails.     Assessment / Recommendations / Plan   Plan Continue with current plan of care     Progression Toward Goals   Progression toward goals Progressing toward goals          SLP Education - 03/03/17 0954    Education provided Yes   Education Details compensations for proofreading    Person(s) Educated Patient   Methods Explanation   Comprehension Verbalized understanding          SLP  Short Term Goals - 03/03/17 1002      SLP SHORT TERM GOAL #1   Title pt will verbally complete mod complex description tasks 85% with rare min A   Status Not Met     SLP SHORT TERM GOAL #2   Title pt will complete mod complex verbal tasks with 85% success and occasional min A   Status Achieved     SLP SHORT TERM GOAL #3   Title pt will demonstrate ability to write simple one word lists 90% with error awareness/self correction   Status Achieved     SLP SHORT TERM GOAL #4   Title pt will undergo reading assessment post SAH in next 1-2 visits   Status Achieved          SLP Long Term Goals - 03/03/17 1002      SLP LONG TERM GOAL #1    Title pt will complete 20 minutes mod complex/complex verbal linguistic tasks or mod complex/complex conversation with rare min A    Status Achieved     SLP LONG TERM GOAL #2   Title pt will complete 20 minutes complex verbal linguistic tasks or complex conversation with modified independence   Time 8   Period Weeks   Status On-going     SLP LONG TERM GOAL #3   Title pt will demo ability to write/type functional professional emails with rare min A   Time 5   Period Weeks   Status On-going     SLP LONG TERM GOAL #4   Title pt will demo ability to functionally write/type professional emails re: complex topics with modified independence   Time 8   Period Weeks   Status On-going     SLP LONG TERM GOAL #5   Title pt will demo understanding of 5-7-sentences of text from a practical source (email, news article, etc)   Time 8   Period Weeks   Status On-going          Plan - 03/03/17 8309    Clinical Impression Statement Pt returned to work this week. She cont to present with expressive aphasia mostly in form of written aphasia, although states that anomia frequency increases as pt's anxiety increases. Pt thinks her reading comprehension has improved. Pt cont to appear highly motivated to improve.  Skilled ST is cont'd to be recommended to improve these skills to as close to PLOF as possible. 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   Speech Therapy Frequency 2x / week   Duration --  12 weeks   Treatment/Interventions Language facilitation;Internal/external aids;SLP instruction and feedback;Functional tasks;Patient/family education;Compensatory strategies   Potential to Achieve Goals Good   Potential Considerations Severity of impairments   Consulted and Agree with Plan of Care Patient      Patient will benefit from skilled therapeutic intervention in order to improve the following deficits and impairments:   Aphasia    Problem  List Patient Active Problem List   Diagnosis Date Noted  . SAH (subarachnoid hemorrhage) ([place]) 01/07/2017  . Subarachnoid bleed ([place]) 01/07/2017    [facility] ,[place], [place]  03/03/2017, 10:04 AM  [facility] [address] [place], [place], [address] Phone: [phone]   Fax:  [phone]   Name: Bonnie Tran MRN: [id number] Date of Birth: 30-Sep-1976

## 2017-03-03 NOTE — Therapy (Signed)
[facility] [address] [place], [place], [address] Phone: [phone]   Fax:  [phone]  Occupational Therapy Treatment  Patient Details  Name: Bonnie Tran Date of Birth: 05-01-1976 Referring Provider: Dr. Venetia MaxonStern  Encounter Date: 03/03/2017      OT End of Session - 03/03/17 1145    Visit Number 7   Number of Visits 25   Authorization Type BCBS   OT Start Time 0935   OT Stop Time 1015   OT Time Calculation (min) 40 min   Activity Tolerance Patient tolerated treatment well   Behavior During Therapy [facility] for tasks assessed/performed      No past medical history on file.  Past Surgical History:  Procedure Laterality Date  . CRANIOTOMY Left 01/07/2017   Procedure: CRANIOTOMY INTRACRANIAL ANEURYSM FOR CAROTID;  Surgeon: Lisbeth RenshawNeelesh Nundkumar, MD;  [facility];  Service: Neurosurgery;  Laterality: Left;  . IR GENERIC HISTORICAL  01/07/2017   IR ANGIO INTRA EXTRACRAN SEL INTERNAL CAROTID BILAT MOD SED 01/07/2017 Lisbeth RenshawNeelesh Nundkumar, MD [facility]INTER[facility]  . IR GENERIC HISTORICAL  01/07/2017   IR ANGIO VERTEBRAL SEL VERTEBRAL BILAT MOD SED 01/07/2017 Lisbeth RenshawNeelesh Nundkumar, MD [facility]  . RADIOLOGY WITH ANESTHESIA N/A 01/07/2017   Procedure: RADIOLOGY WITH ANESTHESIA;  Surgeon: Medication Radiologist, MD;  [facility];  Service: Radiology;  Laterality: N/A;    There were no vitals filed for this visit.      Subjective Assessment - 03/03/17 1143    Subjective  Pt reports she has returned to work for 6 hrs per day   Pertinent History Pt s/p subarachnoid hemorrhage after lifting weights at the gym, ADD   Patient Stated Goals to get back to normal   Currently in Pain? Yes   Pain Score 2    Pain Location Neck   Pain Descriptors / Indicators Aching   Pain Type Acute pain   Pain Onset 1 to 4 weeks ago   Pain Frequency Constant   Aggravating Factors  sleeping on left side   Pain Relieving Factors rest   Multiple Pain Sites No        Treatment: Pt reports she returned to work this week at a reduced schedule. Therapist discussed strategies such as organization, reducing distractions, double checking her work and performing time sensitive tasks first. Pt became tearful when discussing/ listing her job duties. Therapist provided reassurance.  Organization task: Pt typed a menu for dinner for 4 people and estimated the cost, pt required increased time and she reported task was difficult. Therapsit suggested that in the future pt should probably include a salad or vegetable and dessert. (Pt listed pasta dish, bread and wine)                        OT Short Term Goals - 03/03/17 1145      OT SHORT TERM GOAL #1   Title I with HEP-03/16/17   Time 6   Period Weeks   Status New     OT SHORT TERM GOAL #2   Title Pt will verbalize understanding  of compensatory strategies for cognitive/ visual deficits.   Time 6   Period Weeks   Status New     OT SHORT TERM GOAL #3   Title Pt will demonstrate ability to perform mod complex organization/ problem solving task with 95% or better accuracy.   Time 6   Period Weeks   Status New  OT SHORT TERM GOAL #4   Title Pt will perform tabletop scanning with 90% accuracy.   Time 6   Period Weeks   Status New     OT SHORT TERM GOAL #5   Title Pt will perform basic cooking modified independently demonstrating good safety awareness.   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   Time 12   Period Weeks   Status New     OT LONG TERM GOAL #3   Title Pt will perform environmental scanning with 95% accuracy.   Time 12   Period Weeks   Status New     OT LONG TERM GOAL #4   Title Pt will resume mod complex home management and cooking at a modified independent level.   Time 12   Period Weeks   Status New     OT LONG TERM GOAL #5   Title Pt will demonstrate improved standing balance for ADLS/IADLS as evidenced by increasing bilateral standing functional reach test to 10 inches or greater   Baseline RUE 6 inches, LUE 7 inches   Time 12   Period Weeks   Status New               Plan - 03/03/17 0951    Clinical Impression Statement Pt reports she returned back to work and that is why she missed her appointment eralier this week. Pt reports she attempted to call in but she was placed on hold for a long time.   Rehab Potential Good   Clinical Impairments Affecting Rehab Potential decreased awareness of deficits   OT Frequency 2x / week   OT Duration 12 weeks   OT Treatment/Interventions Self-care/ADL training;Moist Heat;Fluidtherapy;DME and/or AE instruction;Patient/family education;Balance training;Therapeutic exercises;Ultrasound;Therapeutic exercise;Therapeutic activities;Cognitive remediation/compensation;Neuromuscular education;Cryotherapy;Energy conservation;Manual Therapy;Visual/perceptual remediation/compensation   Plan continue tow work towards goals, and address work related tasks   Consulted and Agree with Plan of Care Patient      Patient will benefit from skilled therapeutic intervention in order to improve the following deficits and impairments:  Decreased cognition, Impaired vision/preception, Decreased activity tolerance, Decreased endurance, Decreased strength, Impaired UE functional use, Decreased safety awareness, Decreased knowledge of precautions, Decreased balance  Visit Diagnosis: Visuospatial  deficit  Frontal lobe and executive function deficit  Muscle weakness (generalized)    Problem List Patient Active Problem List   Diagnosis Date Noted  . SAH (subarachnoid hemorrhage) (HCC) 01/07/2017  . Subarachnoid bleed (HCC) 01/07/2017    RINE,KATHRYN 03/03/2017, 11:46 AM  [facility] [address] [place], [place], [address] Phone: [phone]   Fax:  [phone]  Name: Bonnie Tran MRN: [id number] Date of Birth: 1976-08-01

## 2017-03-03 NOTE — Patient Instructions (Signed)
  Please complete the assigned speech therapy homework prior to your next session and return it to the speech therapist at your next visit.  

## 2017-03-07 ENCOUNTER — Ambulatory Visit: Payer: BC Managed Care – PPO | Admitting: Occupational Therapy

## 2017-03-08 ENCOUNTER — Ambulatory Visit: Payer: BC Managed Care – PPO

## 2017-03-08 ENCOUNTER — Encounter: Payer: BC Managed Care – PPO | Admitting: Occupational Therapy

## 2017-03-08 ENCOUNTER — Encounter: Payer: BC Managed Care – PPO | Admitting: Speech Pathology

## 2017-03-08 DIAGNOSIS — R4701 Aphasia: Secondary | ICD-10-CM

## 2017-03-08 DIAGNOSIS — R482 Apraxia: Secondary | ICD-10-CM

## 2017-03-08 NOTE — Therapy (Signed)
[facility] [address] [place], [place], [address] Phone: [phone]   Fax:  [phone]  Speech Language Pathology Treatment  Patient Details  Name: Bonnie Tran MRN: [id number] Date of Birth: 22-Jul-1976 Referring Provider: Erline Levine, MD  Encounter Date: 03/08/2017    No past medical history on file.  Past Surgical History:  Procedure Laterality Date  . CRANIOTOMY Left 01/07/2017   Procedure: CRANIOTOMY INTRACRANIAL ANEURYSM FOR CAROTID;  Surgeon: Consuella Lose, MD;  Location: [facility];  Service: Neurosurgery;  Laterality: Left;  . IR GENERIC HISTORICAL  01/07/2017   IR ANGIO INTRA EXTRACRAN SEL INTERNAL CAROTID BILAT MOD SED 01/07/2017 Consuella Lose, MD [facility]  . IR GENERIC HISTORICAL  01/07/2017   IR ANGIO VERTEBRAL SEL VERTEBRAL BILAT MOD SED 01/07/2017 Consuella Lose, MD [facility]  . RADIOLOGY WITH ANESTHESIA N/A 01/07/2017   Procedure: RADIOLOGY WITH ANESTHESIA;  Surgeon: Medication Radiologist, MD;  Location: [facility];  Service: Radiology;  Laterality: N/A;    There were no vitals filed for this visit.      Subjective Assessment - 03/08/17 1542    Subjective "My thinking is better, I dont' stutter anymore, my headaches are gone, my gait is better - all since I've been off the Topamax."   Currently in Pain? 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 Pt's oral motor strength appeared WNL and  pt does not report oral swallowing problems. SLP worked the remainder of the session with pt's articulation and overarticulating with consonant-laden phrases. Pt req'd min-mod cues occasionally for overarticulation.  Pt without anomia in this session.  Pt also reports that since hospitalization she is experiencing trismus from what she thinks on the lt side of her mouth only. Pt states that her written sentence formulation is at baseline, that she may have erased and re-typed a sentence 2-3 times prior to the hospitalization and this occurs with same frequency as premorbidly.      Assessment / Recommendations / Plan   Plan Continue with current plan of care;Goals updated     Progression Toward Goals   Progression toward goals Goals met and updated            SLP Short Term Goals - 03/03/17 1002      SLP SHORT TERM GOAL #1   Title pt will verbally complete mod complex description tasks 85% with rare min A   Status Not Met     SLP SHORT TERM GOAL #2   Title pt will complete mod complex verbal tasks with 85% success and occasional min A   Status Achieved     SLP SHORT TERM GOAL #3   Title pt will demonstrate ability to write simple one word lists 90% with error awareness/self correction   Status Achieved     SLP SHORT TERM GOAL #4   Title pt will undergo reading assessment post SAH in next 1-2 visits   Status Achieved  SLP Long Term Goals - 03/08/17 1658      SLP LONG TERM GOAL #1   Title pt will complete 20 minutes mod complex/complex verbal linguistic tasks or mod complex/complex conversation with rare min A    Status Achieved     SLP LONG TERM GOAL #2   Title pt will complete 20 minutes complex verbal linguistic tasks or complex conversation with modified independence   Status Achieved  reported     SLP LONG TERM GOAL #3   Title pt will demo ability to write/type functional professional emails with rare min A   Status Achieved  reported     SLP LONG TERM  GOAL #4   Title pt will demo ability to functionally write/type professional emails re: complex topics with modified independence   Status Achieved  reported     SLP LONG TERM GOAL #5   Title pt will demo understanding of 5-7-sentences of text from a practical source (email, news article, etc)   Time 8   Period Weeks   Status On-going     Additional Long Term Goals   Additional Long Term Goals Yes     SLP LONG TERM GOAL #6   Title pt will converse in 15 minutes mod complex/complex conversation with modified independence (compensations) over three sessions   Time 8   Period Weeks   Status New          Plan - 03/08/17 1654    Clinical Impression Statement Pt reports this week that her written language is now at baseline. Anomia was not present today. Today she reported what appears to be apraxia in that she "mumbles" sometimes with multisyllable words and has to reduce her rate of speech in order to successfully articulate them. Oral motor function appears WNL.    Speech Therapy Frequency 2x / week   Duration --  12 weeks   Treatment/Interventions Language facilitation;Internal/external aids;SLP instruction and feedback;Functional tasks;Patient/family education;Compensatory strategies   Potential to Achieve Goals Good   Potential Considerations Severity of impairments   Consulted and Agree with Plan of Care Patient      Patient will benefit from skilled therapeutic intervention in order to improve the following deficits and impairments:   Aphasia  Verbal apraxia    Problem List Patient Active Problem List   Diagnosis Date Noted  . SAH (subarachnoid hemorrhage) ([facility]) 01/07/2017  . Subarachnoid bleed ([facility]) 01/07/2017    [facility] ,[place], [place]  03/08/2017, 5:00 PM  [facility] [address] [place], [place], [address] Phone: [phone]   Fax:  [phone]   Name: Marjory Meints MRN: [id number] Date of  Birth: 1976/08/23

## 2017-03-08 NOTE — Patient Instructions (Signed)
Speech Exercises  Repeat 2 times, 2-3 times a day  Call the cat "Buttercup" A calendar of [place] Four floors to cover Yellow oil ointment Fellow lovers of felines Catastrophe in [place] Plump plumbers' plums The church's chimes chimed Telling time 'til eleven Five valve levers Keep the gate closed Go see that guy Fat cows give milk Automatic Data[place] Golden Gophers Fat frogs flip freely Tuck Tommy into bed Stu chews shoes Get that game to Greg Thick thistles stick together Cinnamon aluminum linoleum Black bugs blood Lovely lemon linament Red leather, yellow leather  Big grocery buggy    Purple baby carriage [facility] Proper copper coffee pot Ripe purple cabbage Three free throws [employer]-Illinoisim Tebow tackled  [employer] [place] Dave dipped the dessert  D[employer] International CorporationBlue Devils Buckle that Health Netbracket    The gospel of BJ'sMark Shirts shrink, shells shouldn't [place] 49ers Take the tackle box File the flash message Give me five flapjacks Fundamental relatives Dye the pets purple Talking [place] time after time Dark chocolate chunks Political landscape of the kingdom Actuarylectrical engineering genius We played yo-yos yesterday Sit in solemn silence

## 2017-03-10 ENCOUNTER — Encounter: Payer: BC Managed Care – PPO | Admitting: Occupational Therapy

## 2017-03-10 ENCOUNTER — Encounter: Payer: BC Managed Care – PPO | Admitting: Speech Pathology

## 2017-03-11 ENCOUNTER — Ambulatory Visit: Payer: BC Managed Care – PPO

## 2017-03-11 ENCOUNTER — Ambulatory Visit: Payer: BC Managed Care – PPO | Admitting: Rehabilitative and Restorative Service Providers"

## 2017-03-15 ENCOUNTER — Encounter: Payer: BC Managed Care – PPO | Admitting: Occupational Therapy

## 2017-03-15 ENCOUNTER — Ambulatory Visit: Payer: BC Managed Care – PPO

## 2017-03-15 ENCOUNTER — Encounter: Payer: BC Managed Care – PPO | Admitting: *Deleted

## 2017-03-16 ENCOUNTER — Emergency Department (HOSPITAL_COMMUNITY)
Admission: EM | Admit: 2017-03-16 | Discharge: 2017-03-16 | Disposition: A | Discharge status: Home / Self Care | Payer: BC Managed Care – PPO | Attending: Emergency Medicine | Admitting: Emergency Medicine

## 2017-03-16 ENCOUNTER — Emergency Department (HOSPITAL_COMMUNITY): Payer: BC Managed Care – PPO

## 2017-03-16 ENCOUNTER — Encounter (HOSPITAL_COMMUNITY): Payer: Self-pay | Admitting: Emergency Medicine

## 2017-03-16 DIAGNOSIS — R202 Paresthesia of skin: Secondary | ICD-10-CM | POA: Diagnosis not present

## 2017-03-16 DIAGNOSIS — H5319 Other subjective visual disturbances: Secondary | ICD-10-CM | POA: Diagnosis not present

## 2017-03-16 DIAGNOSIS — R200 Anesthesia of skin: Secondary | ICD-10-CM | POA: Diagnosis present

## 2017-03-16 DIAGNOSIS — Z79899 Other long term (current) drug therapy: Secondary | ICD-10-CM | POA: Insufficient documentation

## 2017-03-16 HISTORY — DX: Aneurysm of unspecified site: I72.9

## 2017-03-16 LAB — I-STAT BETA HCG BLOOD, ED (MC, WL, AP ONLY): I-stat hCG, quantitative: <5 m[IU]/mL (ref <5)

## 2017-03-16 NOTE — ED Triage Notes (Signed)
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

## 2017-03-16 NOTE — ED Provider Notes (Signed)
[facility] Provider Note   CSN: [id number] Arrival date & time: 03/16/17  1049     History   Chief Complaint Chief Complaint  Patient presents with  . Numbness  . Post-op Aneurysm Repair    HPI Bonnie Tran is a 41 y.o. female.  41yo F w/ PMH including brain aneurysm s/p SAH and aneurysm repair who p/w neck pain, visual changes, and numbness. Pt was at work today around 9:30am on the computer when she began having left sided neck pain, which is similar to the neck pain she had with the aneurysm. She then developed flashing lights in her vision, followed by numbness of her R ear/R face and b/l hands. She felt like her whole body went limp and her heart rate increased. No headache, N/V, problems walking, or anxiety. All of her symptoms have resolved except for mild neck pain. No fevers, cough/cold or recent illness.    The history is provided by the patient.    Past Medical History:  Diagnosis Date  . Aneurysm [facility])     Patient Active Problem List   Diagnosis Date Noted  . SAH (subarachnoid hemorrhage) ([facility]) 01/07/2017  . Subarachnoid bleed ([facility]) 01/07/2017    Past Surgical History:  Procedure Laterality Date  . CRANIOTOMY Left 01/07/2017   Procedure: CRANIOTOMY INTRACRANIAL ANEURYSM FOR CAROTID;  Surgeon: Lisbeth Renshaw, MD;  [facility];  Service: Neurosurgery;  Laterality: Left;  . IR GENERIC HISTORICAL  01/07/2017   IR ANGIO INTRA EXTRACRAN SEL INTERNAL CAROTID BILAT MOD SED 01/07/2017 Lisbeth Renshaw, MD [facility]  . IR GENERIC HISTORICAL  01/07/2017   IR ANGIO VERTEBRAL SEL VERTEBRAL BILAT MOD SED 01/07/2017 Lisbeth Renshaw, MD [facility]  . RADIOLOGY WITH ANESTHESIA N/A 01/07/2017   Procedure: RADIOLOGY WITH ANESTHESIA;  Surgeon: Medication Radiologist, MD;  [facility];  Service: Radiology;  Laterality: N/A;    OB History    No data available       Home Medications    Prior to Admission medications   Medication Sig Start Date End  Date Taking? Authorizing Provider  amphetamine-dextroamphetamine (ADDERALL) 10 MG tablet Take 10 mg by mouth 2 (two) times daily with a meal.   Yes [provider]  aspirin-acetaminophen-caffeine (EXCEDRIN MIGRAINE) 250-250-65 MG tablet Take 1 tablet by mouth every 6 (six) hours as needed for headache.   Yes [provider]  citalopram (CELEXA) 10 MG tablet Take 10 mg by mouth daily.   Yes [provider]  clonazePAM (KLONOPIN) 1 MG tablet Take 0.5-1 mg by mouth daily as needed for anxiety.    Yes [provider]  cyclobenzaprine (FLEXERIL) 10 MG tablet Take 10 mg by mouth at bedtime. 02/22/17  Yes [provider]  meloxicam (MOBIC) 15 MG tablet Take 15 mg by mouth daily. 02/22/17  Yes [provider]  oxyCODONE-acetaminophen (PERCOCET/ROXICET) 5-325 MG tablet Take 1 tablet by mouth every 6 (six) hours as needed for severe pain.   Yes [provider]  topiramate (TOPAMAX) 50 MG tablet Take 50 mg by mouth daily.    Yes [provider]  butalbital-acetaminophen-caffeine (FIORICET, ESGIC) 50-325-40 MG tablet Take 1-2 tablets by mouth every 6 (six) hours as needed for headache. Patient not taking: Reported on 02/23/2017 02/18/17 02/18/18  Tilden Fossa, MD  HYDROcodone-acetaminophen (NORCO/VICODIN) 5-325 MG tablet Take 1 tablet by mouth every 4 (four) hours as needed for moderate pain. Patient not taking: Reported on 03/16/2017 01/21/17   Maeola Harman, MD  levETIRAcetam (KEPPRA) 500 MG tablet  Take 1 tablet (500 mg total) by mouth every 12 (twelve) hours. Patient not taking: Reported on 03/16/2017 01/21/17   Maeola Harman, MD  niMODipine (NIMOTOP) 30 MG capsule Take 2 capsules (60 mg total) by mouth every 4 (four) hours. Patient not taking: Reported on 03/16/2017 01/21/17   Maeola Harman, MD    Family History No family history on file.  Social History Social History  Substance Use Topics  . Smoking status: Never Smoker  . Smokeless tobacco:  Never Used  . Alcohol use No     Allergies   Patient has no known allergies.   Review of Systems Review of Systems All other systems reviewed and are negative except that which was mentioned in HPI   Physical Exam Updated Vital Signs BP 126/87 (BP Location: Left Arm)   Pulse (!) 110   Temp 98.7 F (37.1 C) (Oral)   Resp 20   Ht 5\' 4"  (1.626 m)   Wt 60.8 kg (134 lb)   LMP 02/23/2017 (Exact Date)   SpO2 100%   BMI 23.00 kg/m   Physical Exam  Constitutional: She is oriented to person, place, and time. She appears well-developed and well-nourished. No distress.  Awake, alert  HENT:  Head: Normocephalic and atraumatic.  Eyes: Conjunctivae and EOM are normal. Pupils are equal, round, and reactive to light.  Neck: Neck supple.  Cardiovascular: Normal rate, regular rhythm and normal heart sounds.   No murmur heard. Pulmonary/Chest: Effort normal and breath sounds normal. No respiratory distress.  Abdominal: Soft. Bowel sounds are normal. She exhibits no distension. There is no tenderness.  Musculoskeletal: She exhibits no edema.  Neurological: She is alert and oriented to person, place, and time. She has normal reflexes. No cranial nerve deficit. She exhibits normal muscle tone.  Fluent speech, normal finger-to-nose testing, negative pronator drift, no clonus 5/5 strength and normal sensation x all 4 extremities Very mildly decreased sensation L face (chronic since surgery)  Skin: Skin is warm and dry.  Psychiatric: Judgment and thought content normal.  anxious  Nursing note and vitals reviewed.    ED Treatments / Results  Labs (all labs ordered are listed, but only abnormal results are displayed) Labs Reviewed  I-STAT BETA HCG BLOOD, ED ([facility], [facility], [facility] ONLY)    EKG  EKG Interpretation None       Radiology Ct Head Wo Contrast  Result Date: 03/16/2017 CLINICAL DATA:  41 year old female with history of sudden onset numbness of face and hands. EXAM: CT HEAD  WITHOUT CONTRAST TECHNIQUE: Contiguous axial images were obtained from the base of the skull through the vertex without intravenous contrast. COMPARISON:  CT 02/17/2017, 01/07/2017, 01/06/2017 FINDINGS: Brain: No acute intracranial hemorrhage. No midline shift or mass effect. Gray-white differentiation maintained. Unremarkable configuration the ventricles. Vascular: Surgical changes of prior left posterior communicating artery aneurysm clipping. Skull: Surgical changes of left pterional craniotomy Sinuses/Orbits: Unremarkable appearance of the orbits. Other: None IMPRESSION: No CT evidence of acute intracranial abnormality. 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   On: 03/16/2017 12:21    Procedures Procedures (including critical care time)  Medications Ordered in ED Medications - No data to display   Initial Impression / Assessment and Plan / ED Course  I have reviewed the triage vital signs and the nursing notes.  Pertinent labs & imaging results that were available during my care of the patient were reviewed  by me and considered in my medical decision making (see chart for details).     PT w/ h/o brain aneurysm repair who p/w Sudden onset of left neck pain associated with face numbness and bilateral hand numbness. Most of her symptoms had resolved on arrival to the ED with the exception of mild left neck pain. She denied any headache. Vital signs stable. Normal neurologic exam. Because of her complex hx, obtained head CT to r/o acute process as she is within 6 hours of time of onset.   Head CT negative. PT well appearing and denying complaints on Examination. Given that she has had no headache and has a normal neurologic exam, I doubt life-threatening process. Her symptoms have resolved without any intervention. I have instructed her to follow-up with her  neurologist and I have extensively reviewed return precautions with her. She was able to repeat return precautions and voiced understanding of plan. Patient discharged in satisfactory condition.  Final Clinical Impressions(s) / ED Diagnoses   Final diagnoses:  Paresthesias    New Prescriptions Discharge Medication List as of 03/16/2017  1:42 PM       Little, Ambrose Finlandachel Morgan, MD 03/16/17 [phone]

## 2017-03-17 ENCOUNTER — Ambulatory Visit: Payer: BC Managed Care – PPO

## 2017-03-17 ENCOUNTER — Encounter: Payer: BC Managed Care – PPO | Admitting: Occupational Therapy

## 2017-03-21 ENCOUNTER — Ambulatory Visit: Payer: BC Managed Care – PPO | Attending: Neurosurgery | Admitting: Occupational Therapy

## 2017-03-21 ENCOUNTER — Ambulatory Visit: Payer: BC Managed Care – PPO | Admitting: Speech Pathology

## 2017-03-22 ENCOUNTER — Encounter: Payer: BC Managed Care – PPO | Admitting: Occupational Therapy

## 2017-03-23 ENCOUNTER — Encounter: Attending: Physical Medicine & Rehabilitation | Admitting: Physical Medicine & Rehabilitation

## 2017-03-24 ENCOUNTER — Ambulatory Visit: Payer: BC Managed Care – PPO

## 2017-03-24 ENCOUNTER — Ambulatory Visit: Payer: BC Managed Care – PPO | Admitting: Occupational Therapy

## 2017-05-31 ENCOUNTER — Encounter: Payer: Self-pay | Admitting: Neurology

## 2017-07-13 ENCOUNTER — Ambulatory Visit: Admitting: Neurology

## 2018-02-02 ENCOUNTER — Other Ambulatory Visit: Payer: Self-pay | Admitting: Family Medicine

## 2018-02-02 DIAGNOSIS — M5412 Radiculopathy, cervical region: Secondary | ICD-10-CM

## 2018-08-01 NOTE — Therapy (Signed)
[facility] [address] [place], [place], [address] Phone: [phone]   Fax:  [phone]  Patient Details  Name: Bonnie Tran MRN: [id number] Date of Birth: April 12, 1976 Referring Provider:  Erline Levine, MD  Encounter Date: 08/01/2018  SPEECH THERAPY DISCHARGE SUMMARY  Visits from Start of Care: 8  Current functional level related to goals / functional outcomes: Pt did not return to ST after 8th visit. Goals at that time were as follows:  SLP Short Term Goals - 03/03/17 1002              SLP SHORT TERM GOAL #1    Title pt will verbally complete mod complex description tasks 85% with rare min A    Status Not Met         SLP SHORT TERM GOAL #2    Title pt will complete mod complex verbal tasks with 85% success and occasional min A    Status Achieved         SLP SHORT TERM GOAL #3    Title pt will demonstrate ability to write simple one word lists 90% with error awareness/self correction    Status Achieved         SLP SHORT TERM GOAL #4    Title pt will undergo reading assessment post SAH in next 1-2 visits    Status Achieved                       SLP Long Term Goals - 03/08/17 1658              SLP LONG TERM GOAL #1    Title pt will complete 20 minutes mod complex/complex verbal linguistic tasks or mod complex/complex conversation with rare min A     Status Achieved         SLP LONG TERM GOAL #2    Title pt will complete 20 minutes complex verbal linguistic tasks or complex conversation with modified independence    Status Achieved  reported         SLP LONG TERM GOAL #3    Title pt will demo ability to write/type functional professional emails with rare min A    Status Achieved  reported         SLP LONG TERM GOAL #4    Title pt will demo ability to functionally write/type professional emails re: complex topics with modified independence    Status Achieved  reported         SLP LONG TERM  GOAL #5    Title pt will demo understanding of 5-7-sentences of text from a practical source (email, news article, etc)    Time 8    Period Weeks    Status On-going         Additional Long Term Goals    Additional Long Term Goals Yes         SLP LONG TERM GOAL #6    Title pt will converse in 15 minutes mod complex/complex conversation with modified independence (compensations) over three sessions    Time 8    Period Weeks    Status New       Remaining deficits: Assumed that pt still has some degree of aphasia, unknown, however as pt has not been seen since 2018.   Education / Equipment: Compensations.   Plan: Patient agrees to discharge.  Patient goals were partially met. Patient is being discharged due to not returning since the  last visit.  ?????       [name] ,MS, CCC-SLP  08/01/2018, 2:59 PM  [facility] [address] [place], [place], [address] Phone: [phone]   Fax:  [phone]

## 2018-10-27 IMAGING — XA IR CAROTID INTERNAL HEAD/NECK BILAT  (MS)
8 of 10 series · 13 of 24 positions shown · IV contrast (IODINE)
Comparison: none

PROCEDURE:
DIAGNOSTIC CEREBRAL ANGIOGRAM
HISTORY: The patient is a 40-year-old woman who initially presented to the
emergency department with sudden onset of severe headache. CT scan
demonstrated diffuse subarachnoid hemorrhage. She therefore presents
for diagnostic cerebral angiogram for further workup with possible
endovascular coiling.
TECHNIQUE: CATHETERS AND WIRES
5-French JB-1 glidecatheter

[Series 1: carotid care 2 · 2 acquisitions, 2 frames shown (1 of 6)]
[im 1/2]
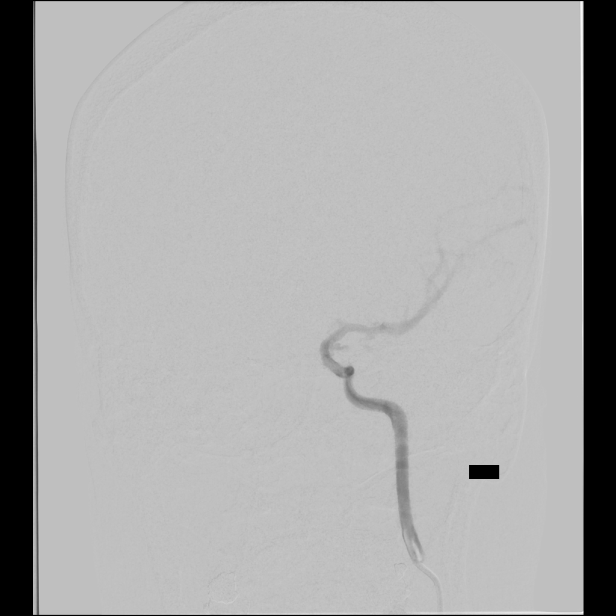
[im 2/2]
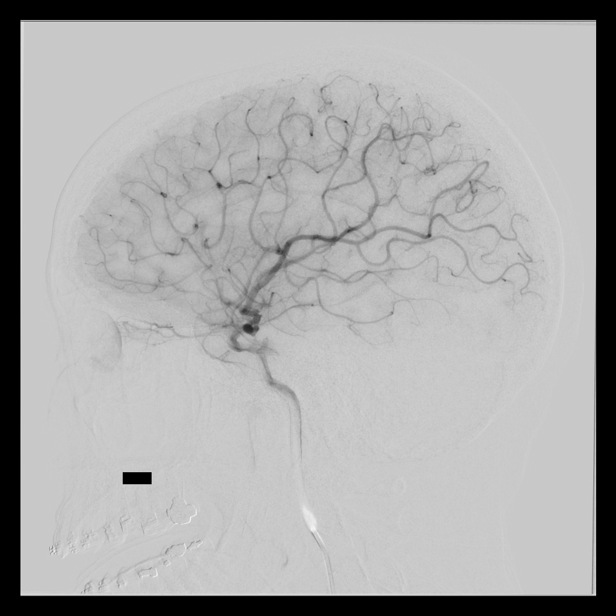

[Series 2: carotid care 2 · 2 acquisitions, 1 frame shown (2 of 6)]
[im 1/2]
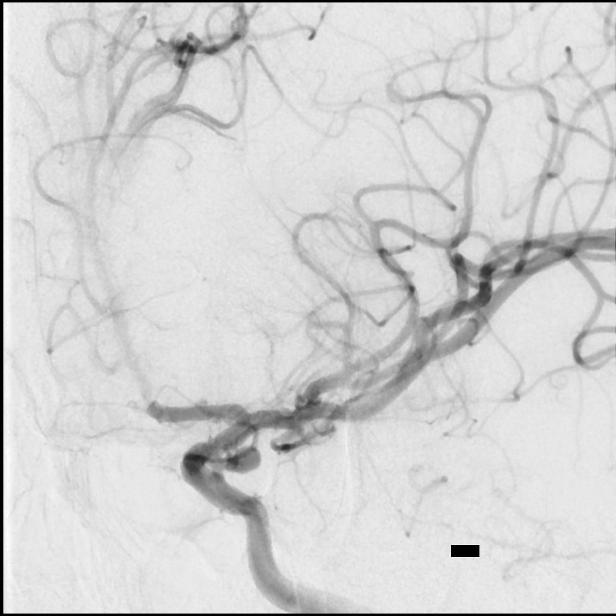

[Series 3: carotid care 2 · 2 acquisitions, 2 frames shown (3 of 6)]
[im 1/2]
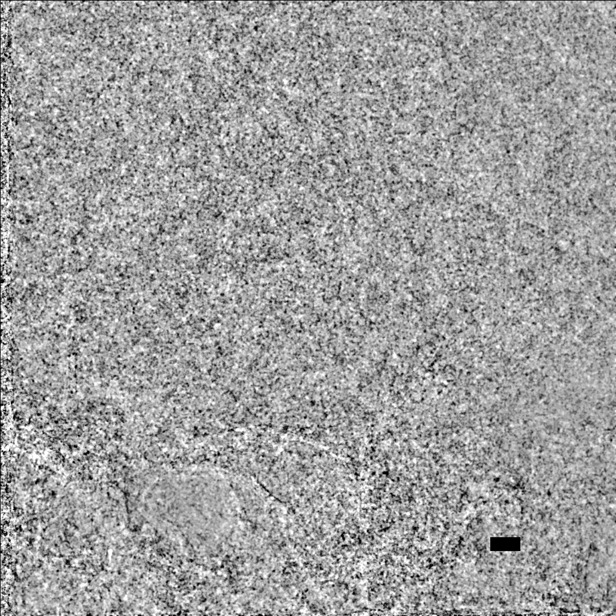
[im 2/2]
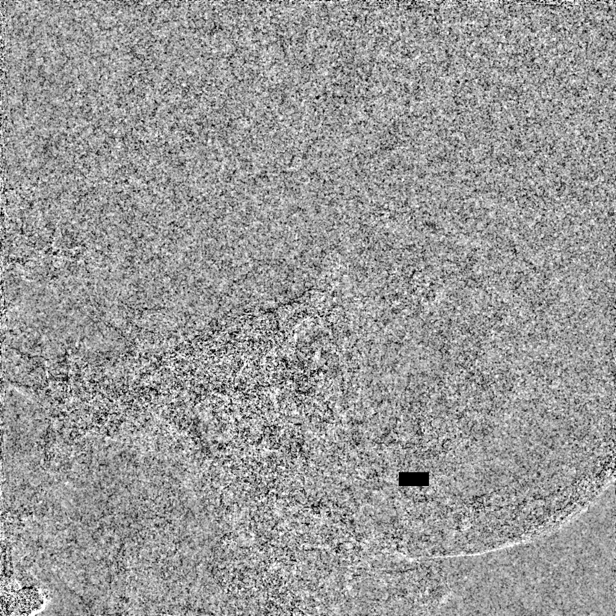

[Series 4: carotid care 2 · 2 acquisitions, 1 frame shown (4 of 6)]
[im 1/2]
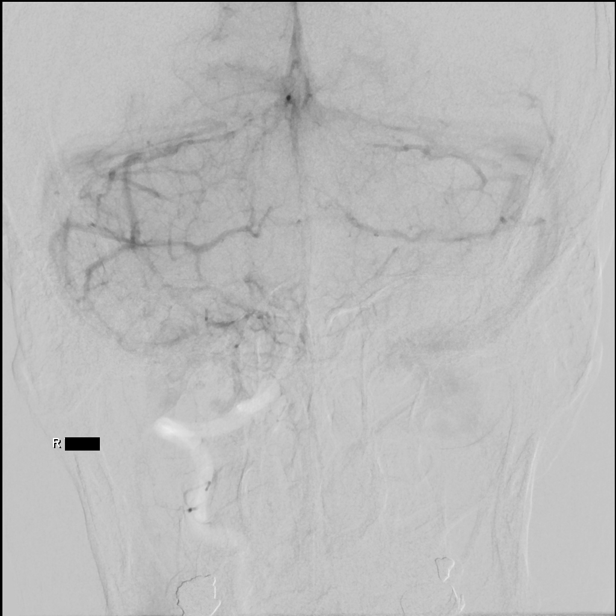

[Series 6: carotid care 2 · 2 acquisitions, 2 frames shown (5 of 6)]
[im 1/2]
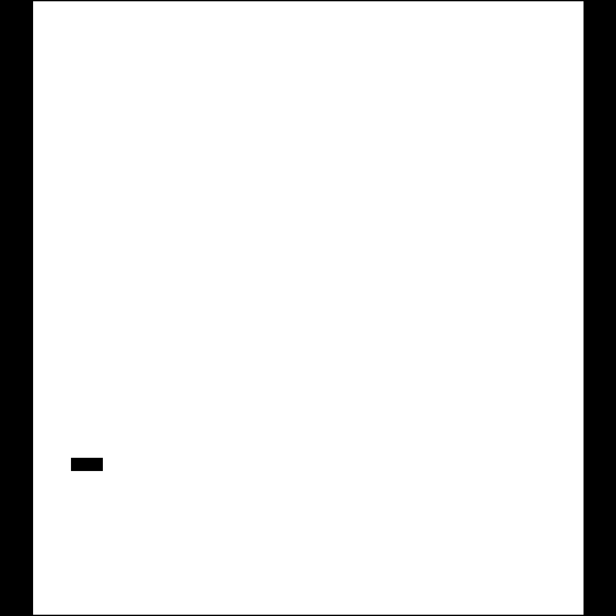
[im 1/2]
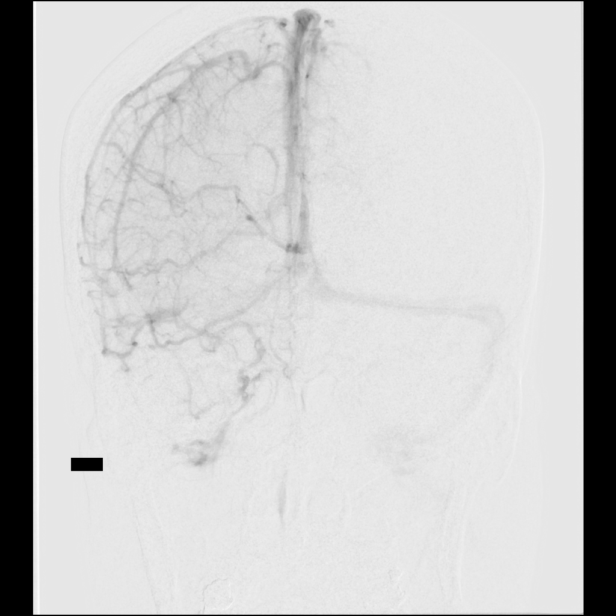

[Series 8: carotid care 2 · 2 acquisitions, 1 frame shown (6 of 6)]
[im 1/2]
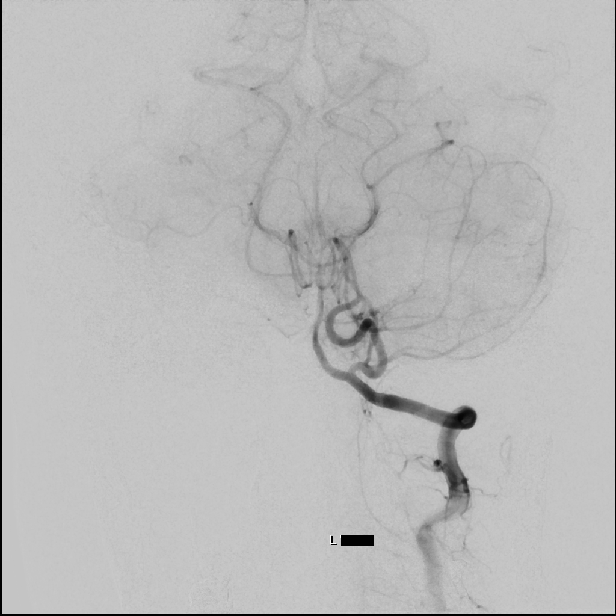

[Series 9: fl neuro n · 1 of 32 frames shown]
[frame 10/32]
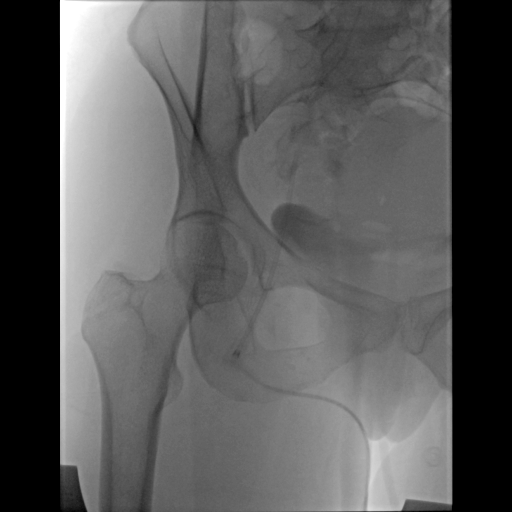

[Series 300: dr. (person_name) · 3 of 17 slices shown]
[im 3/17]
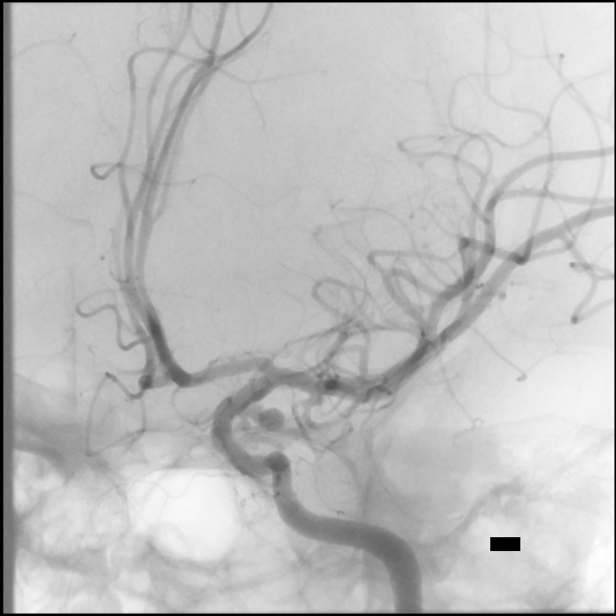
[im 9/17]
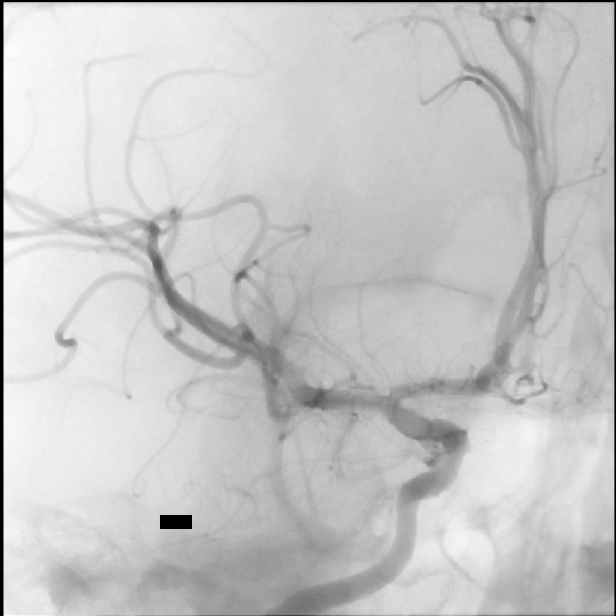
[im 17/17]
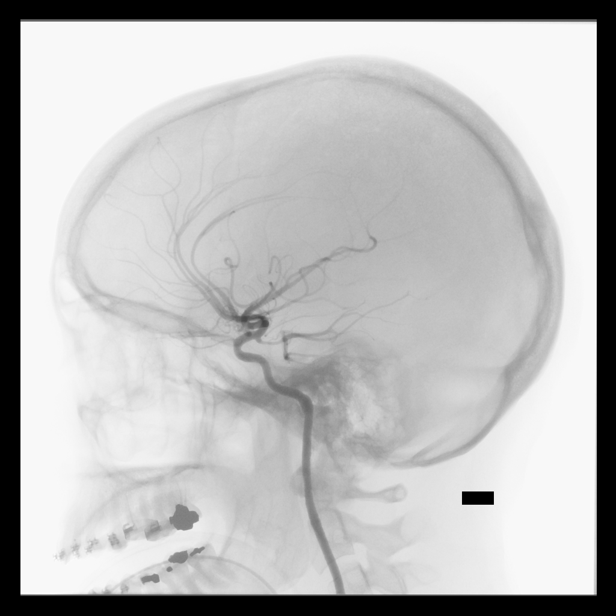

[13 of 24 positions shown; findings below may reference images not displayed]

ACCESS:
The technical aspects of the procedure as well as its potential
risks and benefits were reviewed with the patient's family. These
risks included but were not limited bleeding, infection, allergic
reaction, damage to organs / vital structures, stroke,
non-diagnostic procedure, and the catastrophic outcomes of heart
attack, coma, and death. With an understanding of these risks,
informed consent was obtained and witnessed. The patient was placed
in the supine position on the angiography table and the skin of
right groin prepped in the usual sterile fashion. The procedure was
performed under local anesthesia (1%-solution of
bicarbonate-bufferred Lidocaine) and conscious sedation with Versed
and fentanyl monitored by the in-suite nurse. A 5- French sheath was
introduced in the right common femoral artery using Seldinger
technique. A fluorophase sequence was used to document the sheath
position.

MEDICATIONS:
HEPARIN: 0 units total.

CONTRAST:  cc, Omnipaque 300

FLUOROSCOPY TIME:  FLUOROSCOPY TIME: 1.4 combined AP and lateral
minutes
0.035" glidewire

VESSELS CATHETERIZED
Right internal carotid

Left internal carotid

Left vertebral

Right vertebral

Right common femoral

VESSELS STUDIED
Right internal carotid, head

Left internal carotid, head

Left vertebral

Right vertebral

Right femoral

PROCEDURAL NARRATIVE
A 5-Fr JB-1 terumo glide catheter was advanced over a
glidewire into the aortic arch. The above vessels were then
sequentially catheterized and cerebral angiograms taken. After
review of images, the catheter was removed without incident.
FINDINGS: Left internal carotid: head:

Injection reveals the presence of a widely patent ICA, M1, and A1
segments and their branches. There is a tri-lobed posteriorly
directed posterior communicating artery aneurysm. The posterior
communicating artery proper is somewhat small and only flash filling
is seen. Aneurysm measures approximately 6.4 x 4.2 mm in maximal
dimension. The parenchymal and venous phases are normal. The venous
sinuses are widely patent.

Right internal carotid: head:

Injection reveals the presence of a widely patent ICA, A1, and M1
segments and their branches. There is slight dilatation of the
distal cervical/ proximal petrous internal carotid artery. There is
no significant stenosis, occlusion, aneurysm, or high flow vascular
malformation visualized. Incidental note is made of a triplicate A2.
The parenchymal and venous phases are normal. The venous sinuses are
widely patent.

Left vertebral:

Injection reveals the presence of a widely patent vertebral artery.
This leads to a widely patent basilar artery that terminates in
bilateral P1. The basilar apex is normal. There is no significant
stenosis, occlusion, aneurysm, or vascular malformation visualized.
The parenchymal and venous phases are normal. The venous sinuses are
widely patent.

Right vertebral:

The vertebral artery is widely patent. No PICA aneurysm is seen,
incidental note is made of extradural origin. See basilar
description above.

Right femoral:

Normal vessel. No significant atherosclerotic disease. Arterial
sheath in adequate position.

DISPOSITION:
Upon completion of the study, the femoral sheath was removed and
hemostasis obtained using a 5-Fr ExoSeal closure device. Good
proximal and distal lower extremity pulses were documented upon
achievement of hemostasis. The procedure was well tolerated and no
early complications were observed. The patient was transferred to
the operating room for further treatment.
IMPRESSION: 1. Irregular, tri-lobed left posterior communicating artery aneurysm
is the likely source of hemorrhage. Given the irregular nature, it
is felt that surgical clipping would offer a better chance for
persistent long-term occlusion.

The preliminary results of this procedure were shared with the
patient's family.

## 2018-10-29 IMAGING — DX DG CHEST 1V PORT
1 series · 1 of 1 positions shown · non-contrast
Comparison: January 07, 2017

CLINICAL DATA: Respiratory failure

EXAM:
PORTABLE CHEST 1 VIEW

[chest ap]
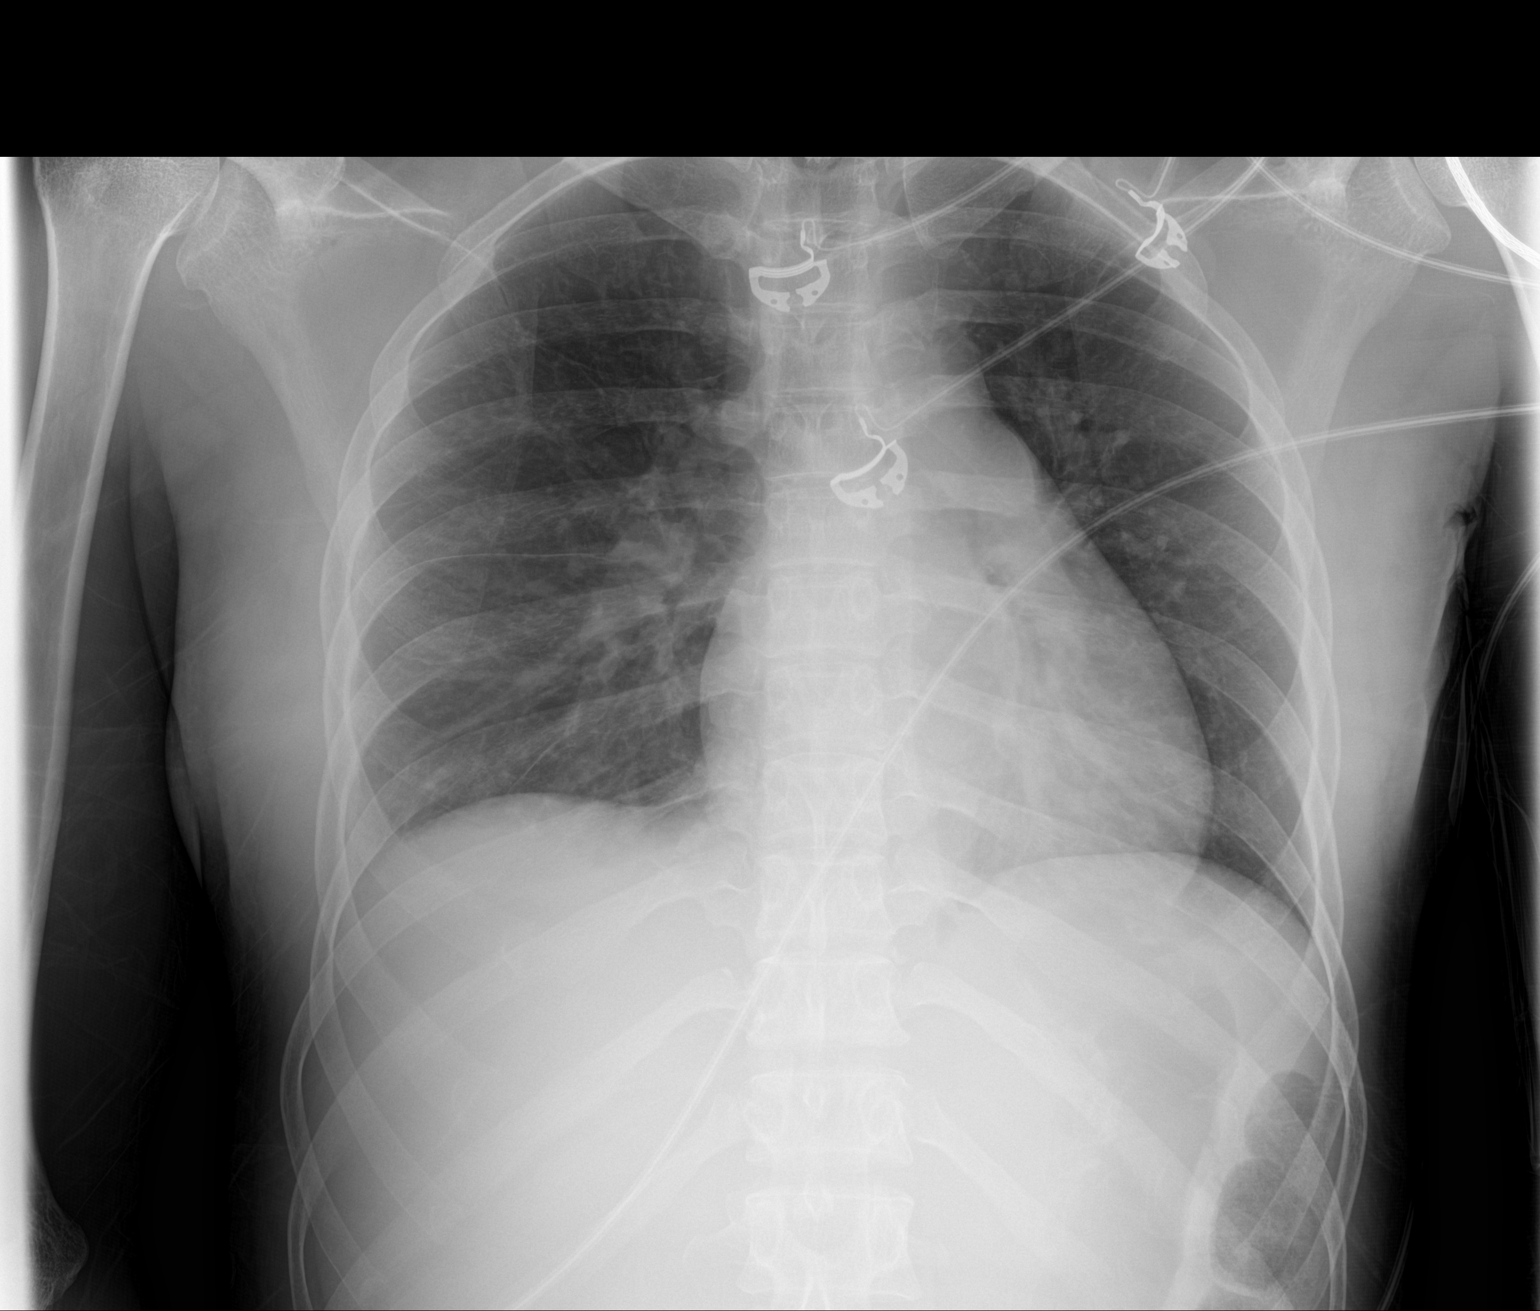

[1 of 1 positions shown; findings below may reference images not displayed]

FINDINGS: Endotracheal tube and nasogastric tube have been removed. No
pneumothorax. Lungs are clear. Heart is upper normal in size with
pulmonary vascularity within normal limits. No adenopathy. No bone
lesions.
IMPRESSION: No pneumothorax.  No edema or consolidation.

## 2021-07-01 ENCOUNTER — Emergency Department (HOSPITAL_COMMUNITY)

## 2021-07-01 ENCOUNTER — Encounter (HOSPITAL_COMMUNITY): Payer: Self-pay | Admitting: Emergency Medicine

## 2021-07-01 ENCOUNTER — Emergency Department (HOSPITAL_COMMUNITY)
Admission: EM | Admit: 2021-07-01 | Discharge: 2021-07-01 | Disposition: A | Discharge status: Home / Self Care | Attending: Emergency Medicine | Admitting: Emergency Medicine

## 2021-07-01 DIAGNOSIS — O208 Other hemorrhage in early pregnancy: Secondary | ICD-10-CM | POA: Diagnosis present

## 2021-07-01 DIAGNOSIS — O039 Complete or unspecified spontaneous abortion without complication: Secondary | ICD-10-CM | POA: Diagnosis not present

## 2021-07-01 DIAGNOSIS — N9489 Other specified conditions associated with female genital organs and menstrual cycle: Secondary | ICD-10-CM | POA: Diagnosis not present

## 2021-07-01 DIAGNOSIS — N939 Abnormal uterine and vaginal bleeding, unspecified: Secondary | ICD-10-CM

## 2021-07-01 DIAGNOSIS — Z7982 Long term (current) use of aspirin: Secondary | ICD-10-CM | POA: Insufficient documentation

## 2021-07-01 DIAGNOSIS — O209 Hemorrhage in early pregnancy, unspecified: Secondary | ICD-10-CM

## 2021-07-01 LAB — CBC WITH DIFFERENTIAL/PLATELET
Abs Immature Granulocytes: 0.03 10*3/uL (ref 0.00–0.07)
Basophils Absolute: 0.1 10*3/uL (ref 0.0–0.1)
Basophils Relative: 1 %
Eosinophils Absolute: 0.1 10*3/uL (ref 0.0–0.5)
Eosinophils Relative: 2 %
HCT: 43.3 % (ref 36.0–46.0)
Hemoglobin: 14.1 g/dL (ref 12.0–15.0)
Immature Granulocytes: 0 %
Lymphocytes Relative: 19 %
Lymphs Abs: 1.3 10*3/uL (ref 0.7–4.0)
MCH: 31.3 pg (ref 26.0–34.0)
MCHC: 32.6 g/dL (ref 30.0–36.0)
MCV: 96 fL (ref 80.0–100.0)
Monocytes Absolute: 0.6 10*3/uL (ref 0.1–1.0)
Monocytes Relative: 9 %
Neutro Abs: 4.9 10*3/uL (ref 1.7–7.7)
Neutrophils Relative %: 69 %
Platelets: 196 10*3/uL (ref 150–400)
RBC: 4.51 MIL/uL (ref 3.87–5.11)
RDW: 14.4 % (ref 11.5–15.5)
WBC: 7.1 10*3/uL (ref 4.0–10.5)
nRBC: 0 % (ref 0.0–0.2)

## 2021-07-01 LAB — I-STAT BETA HCG BLOOD, ED (MC, WL, AP ONLY): I-stat hCG, quantitative: >2000 m[IU]/mL — ABNORMAL HIGH (ref <5)

## 2021-07-01 LAB — HCG, QUANTITATIVE, PREGNANCY: hCG, Beta Chain, Quant, S: 58422 m[IU]/mL — ABNORMAL HIGH (ref <5)

## 2021-07-01 NOTE — Discharge Instructions (Addendum)
Please return for any new or worsening symptoms as discussed.   Please follow-up with OBGYN for a repeat US.

## 2021-07-01 NOTE — ED Notes (Signed)
Lavender and Light Green top tubes sent to lab.

## 2021-07-01 NOTE — ED Triage Notes (Signed)
Per pt, states she is [redacted] weeks pregnant-started bleeding last Wednesday thru Sunday-states she feels like she is having Bonnie Tran contractions-states she tried to get in to see OB but they had no available appointments

## 2021-07-01 NOTE — ED Provider Notes (Signed)
[facility]-EMERGENCY DEPT Provider Note   CSN: [id number] Arrival date & time: 07/01/21  0920     History Chief Complaint  Patient presents with   Abdominal Pain    Bonnie Tran is a 45 y.o. G9P6 female 8-week pregnant by LMP of 05/11/2021 present to the ED for vaginal bleeding and lower abdominal cramping. The patient reports that the vaginal bleeding was from Wednesday through Saturday and was lighter than her usual menstrual period and she was using around 3 regular pads a day. On Sunday, she was noticing some dark residual bleeding and her lower intermittent abdominal cramps began then and have lasted her through today. She denies any nausea, vomiting, diarrhea, or constipation. She has a history of incomplete abortion in 2001 requiring hospital admission.  Pregnancy complications include thrombocytopenia.  She denies getting or needing the RhoGAM shot.    Abdominal Pain Associated symptoms: vaginal bleeding   Associated symptoms: no constipation, no diarrhea, no dysuria, no nausea and no vomiting       Past Medical History:  Diagnosis Date   Aneurysm [facility])     Patient Active Problem List   Diagnosis Date Noted   SAH (subarachnoid hemorrhage) (HCC) 01/07/2017   Subarachnoid bleed (HCC) 01/07/2017    Past Surgical History:  Procedure Laterality Date   CRANIOTOMY Left 01/07/2017   Procedure: CRANIOTOMY INTRACRANIAL ANEURYSM FOR CAROTID;  Surgeon: Lisbeth Renshaw, MD;  Location: [facility] OR;  Service: Neurosurgery;  Laterality: Left;   IR GENERIC HISTORICAL  01/07/2017   IR ANGIO INTRA EXTRACRAN SEL INTERNAL CAROTID BILAT MOD SED 01/07/2017 Lisbeth Renshaw, MD [facility]-INTERV RAD   IR GENERIC HISTORICAL  01/07/2017   IR ANGIO VERTEBRAL SEL VERTEBRAL BILAT MOD SED 01/07/2017 Lisbeth Renshaw, MD [facility]   RADIOLOGY WITH ANESTHESIA N/A 01/07/2017   Procedure: RADIOLOGY WITH ANESTHESIA;  Surgeon: Medication Radiologist, MD;  Location: [facility] OR;  Service: Radiology;   Laterality: N/A;     OB History     Gravida  1   Para      Term      Preterm      AB      Living         SAB      IAB      Ectopic      Multiple      Live Births              No family history on file.  Social History   Tobacco Use   Smoking status: Never   Smokeless tobacco: Never  Vaping Use   Vaping Use: Never used  Substance Use Topics   Alcohol use: No   Drug use: No    Home Medications Prior to Admission medications   Medication Sig Start Date End Date Taking? Authorizing Provider  amphetamine-dextroamphetamine (ADDERALL) 10 MG tablet Take 10 mg by mouth 2 (two) times daily with a meal.    [provider]  aspirin-acetaminophen-caffeine (EXCEDRIN MIGRAINE) 250-250-65 MG tablet Take 1 tablet by mouth every 6 (six) hours as needed for headache.    [provider]  citalopram (CELEXA) 10 MG tablet Take 10 mg by mouth daily.    [provider]  clonazePAM (KLONOPIN) 1 MG tablet Take 0.5-1 mg by mouth daily as needed for anxiety.     [provider]  cyclobenzaprine (FLEXERIL) 10 MG tablet Take 10 mg by mouth at bedtime. 02/22/17   [provider]  HYDROcodone-acetaminophen (NORCO/VICODIN) 5-325 MG tablet Take 1 tablet  by mouth every 4 (four) hours as needed for moderate pain. Patient not taking: Reported on 03/16/2017 01/21/17   Maeola Harman, MD  levETIRAcetam (KEPPRA) 500 MG tablet Take 1 tablet (500 mg total) by mouth every 12 (twelve) hours. Patient not taking: Reported on 03/16/2017 01/21/17   Maeola Harman, MD  meloxicam (MOBIC) 15 MG tablet Take 15 mg by mouth daily. 02/22/17   [provider]  niMODipine (NIMOTOP) 30 MG capsule Take 2 capsules (60 mg total) by mouth every 4 (four) hours. Patient not taking: Reported on 03/16/2017 01/21/17   Maeola Harman, MD  oxyCODONE-acetaminophen (PERCOCET/ROXICET) 5-325 MG tablet Take 1 tablet by mouth every 6 (six) hours as needed for severe pain.    [provider]  topiramate (TOPAMAX) 50 MG tablet Take 50 mg by mouth daily.     [provider]    Allergies    Patient has no known allergies.  Review of Systems   Review of Systems  Gastrointestinal:  Positive for abdominal pain. Negative for constipation, diarrhea, nausea and vomiting.  Genitourinary:  Positive for vaginal bleeding. Negative for dysuria, frequency and urgency.  All other systems reviewed and are negative.  Physical Exam Updated Vital Signs BP (!) 147/92 (BP Location: Right Arm)   Pulse 99   Temp 98.9 F (37.2 C) (Oral)   Resp 18   SpO2 100%   Physical Exam Vitals and nursing note reviewed.  Constitutional:      General: She is not in acute distress.    Appearance: Normal appearance. She is not toxic-appearing.  HENT:     Head: Normocephalic and atraumatic.  Eyes:     General: No scleral icterus. Cardiovascular:     Rate and Rhythm: Normal rate and regular rhythm.  Pulmonary:     Effort: Pulmonary effort is normal.     Breath sounds: Normal breath sounds.  Abdominal:     General: Abdomen is flat. Bowel sounds are normal. There is no distension.     Palpations: Abdomen is soft.     Tenderness: There is abdominal tenderness in the right lower quadrant and left lower quadrant. There is no guarding or rebound.  Genitourinary:    Comments: Patient declined pelvic exam.  Musculoskeletal:        General: No deformity.     Cervical back: Normal range of motion.  Skin:    General: Skin is warm and dry.  Neurological:     General: No focal deficit present.     Mental Status: She is alert. Mental status is at baseline.    ED Results / Procedures / Treatments   Labs (all labs ordered are listed, but only abnormal results are displayed) Labs Reviewed  HCG, QUANTITATIVE, PREGNANCY - Abnormal; Notable for the following components:      Result Value   hCG, Beta Chain, Quant, S 95,284 (*)    All other components within normal limits  I-STAT BETA  HCG BLOOD, ED ([facility], [facility], [facility] ONLY) - Abnormal; Notable for the following components:   I-stat hCG, quantitative >2,000.0 (*)    All other components within normal limits  CBC WITH DIFFERENTIAL/PLATELET  TYPE AND SCREEN    EKG None  Radiology US OB Comp < 14 Wks  Result Date: 07/01/2021 CLINICAL DATA:  Vaginal bleeding EXAM: OBSTETRIC <14 WK [place] AND TRANSVAGINAL OB US TECHNIQUE: Both transabdominal and transvaginal ultrasound examinations were performed for complete evaluation of the gestation as well as the maternal uterus, adnexal regions, and pelvic cul-de-sac.  Transvaginal technique was performed to assess early pregnancy. COMPARISON:  None. FINDINGS: Intrauterine gestational sac: Single Yolk sac:  Visualized. Embryo:  Visualized. Cardiac Activity: Not Visualized. MSD: 16.6 mm   6 w   4 d CRL:  0.5 mm   6 w   1 d                  [place] EDC: 02/23/2022 Subchorionic hemorrhage:  None visualized. Maternal uterus/adnexae: The uterus is normal in appearance. There is no adnexal mass. IMPRESSION: Single intrauterine gestational sac identified with an estimated gestational age of [redacted] weeks 1 day. No cardiac activity was identified. Findings are suspicious but not yet definitive for failed pregnancy. Recommend follow-up US in 10-14 days for definitive diagnosis. This recommendation follows SRU consensus guidelines: Diagnostic Criteria for Nonviable Pregnancy Early in the First Trimester. Malva Limes Med 2013; 505:3976-73. Electronically Signed   By: Lesia Hausen M.D.   On: 07/01/2021 11:33   US OB Transvaginal  Result Date: 07/01/2021 CLINICAL DATA:  Vaginal bleeding EXAM: OBSTETRIC <14 WK [place] AND TRANSVAGINAL OB US TECHNIQUE: Both transabdominal and transvaginal ultrasound examinations were performed for complete evaluation of the gestation as well as the maternal uterus, adnexal regions, and pelvic cul-de-sac. Transvaginal technique was performed to assess early pregnancy. COMPARISON:  None. FINDINGS: Intrauterine  gestational sac: Single Yolk sac:  Visualized. Embryo:  Visualized. Cardiac Activity: Not Visualized. MSD: 16.6 mm   6 w   4 d CRL:  0.5 mm   6 w   1 d                  [place] EDC: 02/23/2022 Subchorionic hemorrhage:  None visualized. Maternal uterus/adnexae: The uterus is normal in appearance. There is no adnexal mass. IMPRESSION: Single intrauterine gestational sac identified with an estimated gestational age of [redacted] weeks 1 day. No cardiac activity was identified. Findings are suspicious but not yet definitive for failed pregnancy. Recommend follow-up US in 10-14 days for definitive diagnosis. This recommendation follows SRU consensus guidelines: Diagnostic Criteria for Nonviable Pregnancy Early in the First Trimester. Malva Limes Med 2013; 419:3790-24. Electronically Signed   By: Lesia Hausen M.D.   On: 07/01/2021 11:33    Procedures Procedures   Medications Ordered in ED Medications - No data to display  ED Course  I have reviewed the triage vital signs and the nursing notes.  Pertinent labs & imaging results that were available during my care of the patient were reviewed by me and considered in my medical decision making (see chart for details).  Bonnie Tran is a 45 y.o. G9P6 female 8-week pregnant by LMP of 05/11/2021 present to the ED for vaginal bleeding and lower abdominal cramping.  Differential includes ectopic pregnancy, incomplete abortion, threatened abortion, complete miscarriage, menstrual bleeding.  I personally reviewed the labs and imaging for this patient.  Her beta-hCG i-STAT registered over 2000, will order a beta quant for trending. Beta quant [id number]. US showed an IUP and absent fetal heart tones. No ectopic pregnancy seen. With the US findings, this is more likely a inevitable or missed abortion.   I consulted OBGYN with findings so that the patient would be able to be worked in for follow-up US. They agreed and will be arranging a follow-up appointment for the patient.   I discussed  return precautions to the patient and stressed the need to OBGYN follow-up. Patient agreed. Was discharged without paperwork.   MDM Rules/Calculators/A&P  Final Clinical Impression(s) / ED Diagnoses Final diagnoses:  Vaginal bleeding  Miscarriage    Rx / DC Orders ED Discharge Orders          Ordered    US OB Transvaginal        07/01/21 1331             Achille Rich, PA-C 07/01/21 1337    Wynetta Fines, MD 07/03/21 [phone]

## 2021-07-02 ENCOUNTER — Emergency Department (HOSPITAL_COMMUNITY)
Admission: EM | Admit: 2021-07-02 | Discharge: 2021-07-02 | Disposition: A | Discharge status: Home / Self Care | Attending: Emergency Medicine | Admitting: Emergency Medicine

## 2021-07-02 ENCOUNTER — Other Ambulatory Visit: Payer: Self-pay

## 2021-07-02 ENCOUNTER — Encounter (HOSPITAL_COMMUNITY): Payer: Self-pay

## 2021-07-02 DIAGNOSIS — O26891 Other specified pregnancy related conditions, first trimester: Secondary | ICD-10-CM | POA: Diagnosis present

## 2021-07-02 DIAGNOSIS — R102 Pelvic and perineal pain: Secondary | ICD-10-CM | POA: Insufficient documentation

## 2021-07-02 DIAGNOSIS — Z7982 Long term (current) use of aspirin: Secondary | ICD-10-CM | POA: Insufficient documentation

## 2021-07-02 DIAGNOSIS — O26899 Other specified pregnancy related conditions, unspecified trimester: Secondary | ICD-10-CM

## 2021-07-02 DIAGNOSIS — O039 Complete or unspecified spontaneous abortion without complication: Secondary | ICD-10-CM | POA: Diagnosis not present

## 2021-07-02 DIAGNOSIS — O034 Incomplete spontaneous abortion without complication: Secondary | ICD-10-CM

## 2021-07-02 NOTE — ED Triage Notes (Signed)
Pt reports contractions and abdominal movement. Pt states that she was told she was having a miscarriage previously. Pt requests an ultrasound.

## 2021-07-02 NOTE — ED Provider Notes (Signed)
[facility]-EMERGENCY DEPT Provider Note   CSN: [id number] Arrival date & time: 07/02/21  0440     History Chief Complaint  Patient presents with   Contractions   abdominal movement     Bonnie Tran is a 45 y.o. female.  The history is provided by the patient and medical records.  Bonnie Tran is a 45 y.o. female who presents to the Emergency Department complaining of pelvic pain and contractions. She is a G9 P6026 that is approximately eight weeks pregnant based off of LMP of July 25. She was evaluated in the emergency department yesterday due to having a light vaginal bleeding from Wednesday through Saturday. The bleeding was described as brown in color. The bleeding has completely resolved. On her ED assessment at that time she had about hCG of 58,000 as well as a transvaginal ultrasound that demonstrated an intrauterine pregnancy without fetal cardiac activity at approximately six weeks and one day. Her last full normal cycle was on June 9. She normally has very irregular cycles. She reports two weeks of sensations that she describes as contractions. She feels like there is movement in her lower abdomen, greatest in the left lower abdomen. She feels this more when she is laying on her abdomen. She did have nausea earlier, now resolved. No fevers, vaginal discharge, dysuria, diarrhea. 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 She does not currently have an OB/GYN. She does have a history of miscarriage times two. 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 Records reviewed in epic. Patient is B positive.   Past Medical History:  Diagnosis Date   Aneurysm [facility])     Patient Active Problem List   Diagnosis Date Noted   SAH (subarachnoid hemorrhage) (HCC) 01/07/2017   Subarachnoid bleed (HCC) 01/07/2017    Past Surgical History:  Procedure Laterality Date   CRANIOTOMY Left 01/07/2017   Procedure: CRANIOTOMY INTRACRANIAL ANEURYSM FOR  CAROTID;  Surgeon: Lisbeth Renshaw, MD;  Location: [facility] OR;  Service: Neurosurgery;  Laterality: Left;   IR GENERIC HISTORICAL  01/07/2017   IR ANGIO INTRA EXTRACRAN SEL INTERNAL CAROTID BILAT MOD SED 01/07/2017 Lisbeth Renshaw, MD [facility]INTER[facility]   IR GENERIC HISTORICAL  01/07/2017   IR ANGIO VERTEBRAL SEL VERTEBRAL BILAT MOD SED 01/07/2017 Lisbeth Renshaw, MD [facility]   RADIOLOGY WITH ANESTHESIA N/A 01/07/2017   Procedure: RADIOLOGY WITH ANESTHESIA;  Surgeon: Medication Radiologist, MD;  Location: [facility] OR;  Service: Radiology;  Laterality: N/A;     OB History     Gravida  1   Para      Term      Preterm      AB      Living         SAB      IAB      Ectopic      Multiple      Live Births              History reviewed. No pertinent family history.  Social History   Tobacco Use   Smoking status: Never   Smokeless tobacco: Never  Vaping Use   Vaping Use: Never used  Substance Use Topics   Alcohol use: No   Drug use: No    Home Medications Prior to Admission medications   Medication Sig Start Date End Date Taking? Authorizing Provider  amphetamine-dextroamphetamine (ADDERALL) 10 MG tablet Take 10 mg by mouth 2 (two) times daily with a meal.  [provider]  aspirin-acetaminophen-caffeine (EXCEDRIN MIGRAINE) [phone] MG tablet Take 1 tablet by mouth every 6 (six) hours as needed for headache.    [provider]  citalopram (CELEXA) 10 MG tablet Take 10 mg by mouth daily.    [provider]  clonazePAM (KLONOPIN) 1 MG tablet Take 0.5-1 mg by mouth daily as needed for anxiety.     [provider]  cyclobenzaprine (FLEXERIL) 10 MG tablet Take 10 mg by mouth at bedtime. 02/22/17   [provider]  HYDROcodone-acetaminophen (NORCO/VICODIN) 5-325 MG tablet Take 1 tablet by mouth every 4 (four) hours as needed for moderate pain. Patient not taking: Reported on 03/16/2017 01/21/17   Maeola Harman, MD  levETIRAcetam  (KEPPRA) 500 MG tablet Take 1 tablet (500 mg total) by mouth every 12 (twelve) hours. Patient not taking: Reported on 03/16/2017 01/21/17   Maeola Harman, MD  meloxicam (MOBIC) 15 MG tablet Take 15 mg by mouth daily. 02/22/17   [provider]  niMODipine (NIMOTOP) 30 MG capsule Take 2 capsules (60 mg total) by mouth every 4 (four) hours. Patient not taking: Reported on 03/16/2017 01/21/17   Maeola Harman, MD  oxyCODONE-acetaminophen (PERCOCET/ROXICET) 5-325 MG tablet Take 1 tablet by mouth every 6 (six) hours as needed for severe pain.    [provider]  topiramate (TOPAMAX) 50 MG tablet Take 50 mg by mouth daily.     [provider]    Allergies    Patient has no known allergies.  Review of Systems   Review of Systems  All other systems reviewed and are negative.  Physical Exam Updated Vital Signs BP (!) 170/102 (BP Location: Right Arm)   Pulse (!) 108   Temp 98.3 F (36.8 C) (Oral)   Resp 16   SpO2 98%   Physical Exam Vitals and nursing note reviewed.  Constitutional:      Appearance: She is well-developed.  HENT:     Head: Normocephalic and atraumatic.  Cardiovascular:     Rate and Rhythm: Normal rate and regular rhythm.  Pulmonary:     Effort: Pulmonary effort is normal. No respiratory distress.  Abdominal:     Palpations: Abdomen is soft.     Tenderness: There is no guarding or rebound.     Comments: Mild lower abdominal tenderness  Genitourinary:    Comments: Patient declined Musculoskeletal:        General: No tenderness.  Skin:    General: Skin is warm and dry.  Neurological:     Mental Status: She is alert and oriented to person, place, and time.  Psychiatric:        Behavior: Behavior normal.    ED Results / Procedures / Treatments   Labs (all labs ordered are listed, but only abnormal results are displayed) Labs Reviewed - No data to display  EKG None  Radiology US OB Comp < 14 Wks  Result Date: 07/01/2021 CLINICAL DATA:   Vaginal bleeding EXAM: OBSTETRIC <14 WK [place] AND TRANSVAGINAL OB US TECHNIQUE: Both transabdominal and transvaginal ultrasound examinations were performed for complete evaluation of the gestation as well as the maternal uterus, adnexal regions, and pelvic cul-de-sac. Transvaginal technique was performed to assess early pregnancy. COMPARISON:  None. FINDINGS: Intrauterine gestational sac: Single Yolk sac:  Visualized. Embryo:  Visualized. Cardiac Activity: Not Visualized. MSD: 16.6 mm   6 w   4 d CRL:  0.5 mm   6 w   1 d  [place] EDC: 02/23/2022 Subchorionic hemorrhage:  None visualized. Maternal uterus/adnexae: The uterus is normal in appearance. There is no adnexal mass. IMPRESSION: Single intrauterine gestational sac identified with an estimated gestational age of [redacted] weeks 1 day. No cardiac activity was identified. Findings are suspicious but not yet definitive for failed pregnancy. Recommend follow-up US in 10-14 days for definitive diagnosis. This recommendation follows SRU consensus guidelines: Diagnostic Criteria for Nonviable Pregnancy Early in the First Trimester. Malva Limes Med 2013; 503:5465-68. Electronically Signed   By: Lesia Hausen M.D.   On: 07/01/2021 11:33   US OB Transvaginal  Result Date: 07/01/2021 CLINICAL DATA:  Vaginal bleeding EXAM: OBSTETRIC <14 WK [place] AND TRANSVAGINAL OB US TECHNIQUE: Both transabdominal and transvaginal ultrasound examinations were performed for complete evaluation of the gestation as well as the maternal uterus, adnexal regions, and pelvic cul-de-sac. Transvaginal technique was performed to assess early pregnancy. COMPARISON:  None. FINDINGS: Intrauterine gestational sac: Single Yolk sac:  Visualized. Embryo:  Visualized. Cardiac Activity: Not Visualized. MSD: 16.6 mm   6 w   4 d CRL:  0.5 mm   6 w   1 d                  [place] EDC: 02/23/2022 Subchorionic hemorrhage:  None visualized. Maternal uterus/adnexae: The uterus is normal in appearance. There is no adnexal  mass. IMPRESSION: Single intrauterine gestational sac identified with an estimated gestational age of [redacted] weeks 1 day. No cardiac activity was identified. Findings are suspicious but not yet definitive for failed pregnancy. Recommend follow-up US in 10-14 days for definitive diagnosis. This recommendation follows SRU consensus guidelines: Diagnostic Criteria for Nonviable Pregnancy Early in the First Trimester. Malva Limes Med 2013; 127:5170-01. Electronically Signed   By: Lesia Hausen M.D.   On: 07/01/2021 11:33    Procedures Procedures  Bedside US with gestational sac in uterus, no cardiac activity.    Abdominal aortic US with no AAA.    Medications Ordered in ED Medications - No data to display  ED Course  I have reviewed the triage vital signs and the nursing notes.  Pertinent labs & imaging results that were available during my care of the patient were reviewed by me and considered in my medical decision making (see chart for details).    MDM Rules/Calculators/A&P                          patient here for evaluation of persistent pelvic pain described as contractions. She had an ultrasound yesterday that demonstrated IUP, but was concerning for failed pregnancy. Patient without current bleeding, no systemic symptoms.  Discussed recommendation for pelvic examination to evaluate for possible early infection, better evaluate for miscarriage - patient declines pelvic exam in ED.   Discussed importance of OB follow-up as well as return precautions. Final Clinical Impression(s) / ED Diagnoses Final diagnoses:  Pelvic pain in pregnancy  Inevitable abortion    Rx / DC Orders ED Discharge Orders     None        Tilden Fossa, MD 07/02/21 [phone]

## 2021-07-13 ENCOUNTER — Ambulatory Visit: Admission: RE | Admit: 2021-07-13 | Source: Ambulatory Visit

## 2021-07-16 ENCOUNTER — Inpatient Hospital Stay (HOSPITAL_COMMUNITY)
Admission: AD | Admit: 2021-07-16 | Discharge: 2021-07-16 | Disposition: A | Discharge status: Home / Self Care | Attending: Obstetrics & Gynecology | Admitting: Obstetrics & Gynecology

## 2021-07-16 ENCOUNTER — Encounter: Payer: Self-pay | Admitting: Advanced Practice Midwife

## 2021-07-16 ENCOUNTER — Inpatient Hospital Stay (HOSPITAL_COMMUNITY)

## 2021-07-16 DIAGNOSIS — Z3A09 9 weeks gestation of pregnancy: Secondary | ICD-10-CM | POA: Insufficient documentation

## 2021-07-16 DIAGNOSIS — Z791 Long term (current) use of non-steroidal anti-inflammatories (NSAID): Secondary | ICD-10-CM | POA: Insufficient documentation

## 2021-07-16 DIAGNOSIS — O034 Incomplete spontaneous abortion without complication: Secondary | ICD-10-CM | POA: Diagnosis not present

## 2021-07-16 DIAGNOSIS — Z79899 Other long term (current) drug therapy: Secondary | ICD-10-CM | POA: Diagnosis not present

## 2021-07-16 DIAGNOSIS — O209 Hemorrhage in early pregnancy, unspecified: Secondary | ICD-10-CM

## 2021-07-16 DIAGNOSIS — O021 Missed abortion: Secondary | ICD-10-CM

## 2021-07-16 DIAGNOSIS — R109 Unspecified abdominal pain: Secondary | ICD-10-CM | POA: Diagnosis present

## 2021-07-16 LAB — CBC
HCT: 40.9 % (ref 36.0–46.0)
Hemoglobin: 13.6 g/dL (ref 12.0–15.0)
MCH: 31.8 pg (ref 26.0–34.0)
MCHC: 33.3 g/dL (ref 30.0–36.0)
MCV: 95.6 fL (ref 80.0–100.0)
Platelets: 178 10*3/uL (ref 150–400)
RBC: 4.28 MIL/uL (ref 3.87–5.11)
RDW: 13.2 % (ref 11.5–15.5)
WBC: 18.8 10*3/uL — ABNORMAL HIGH (ref 4.0–10.5)
nRBC: 0 % (ref 0.0–0.2)

## 2021-07-16 MED ORDER — OXYCODONE HCL 5 MG PO TABS: 5.0000 mg | ORAL_TABLET | Freq: Four times a day (QID) | ORAL | 0 refills | Status: AC | PRN

## 2021-07-16 MED ORDER — PROMETHAZINE HCL 25 MG PO TABS: 25.0000 mg | ORAL_TABLET | Freq: Four times a day (QID) | ORAL | 2 refills | Status: AC | PRN

## 2021-07-16 MED ORDER — KETOROLAC TROMETHAMINE 30 MG/ML IJ SOLN
30.0000 mg | Freq: Once | INTRAMUSCULAR | Status: AC
Start: 2021-07-16 — End: 2021-07-16
  Administered 2021-07-16: 30 mg via INTRAMUSCULAR
  Filled 2021-07-16: qty 1

## 2021-07-16 MED ORDER — IBUPROFEN 400 MG PO TABS: 400.0000 mg | ORAL_TABLET | Freq: Four times a day (QID) | ORAL | 0 refills | Status: AC | PRN

## 2021-07-16 MED ORDER — MISOPROSTOL 200 MCG PO TABS
800.0000 ug | ORAL_TABLET | Freq: Once | ORAL | Status: AC
  Administered 2021-07-16: 800 ug via BUCCAL
  Filled 2021-07-16: qty 4

## 2021-07-16 MED ORDER — HYDROMORPHONE HCL 1 MG/ML IJ SOLN
1.0000 mg | Freq: Once | INTRAMUSCULAR | Status: AC
  Administered 2021-07-16: 1 mg via INTRAMUSCULAR
  Filled 2021-07-16: qty 1
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
## 2021-07-16 NOTE — MAU Note (Signed)
Reports missed AB, bleeding for about a week with large clots passed earlier this evening. States pain 10/10.

## 2021-07-16 NOTE — Discharge Instructions (Signed)

## 2021-07-28 ENCOUNTER — Telehealth: Payer: Self-pay | Admitting: Radiology

## 2021-07-28 NOTE — Telephone Encounter (Signed)
Left message to call [facility], received message on 07/13/21 in workqueue that patient was seen in MAU and had an incomplete miscarriage. Was given cytotec,  Needs a lab and RN visit ASAP for CBC and HCG check and to see if she passed all the tissue without any problems

## 2021-11-09 ENCOUNTER — Other Ambulatory Visit: Payer: Self-pay | Admitting: Family Medicine

## 2021-11-09 DIAGNOSIS — Z1231 Encounter for screening mammogram for malignant neoplasm of breast: Secondary | ICD-10-CM

## 2021-11-10 ENCOUNTER — Encounter

## 2021-11-10 DIAGNOSIS — Z1231 Encounter for screening mammogram for malignant neoplasm of breast: Secondary | ICD-10-CM

## 2022-08-19 ENCOUNTER — Ambulatory Visit
Admission: EM | Admit: 2022-08-19 | Discharge: 2022-08-19 | Disposition: A | Discharge status: Home / Self Care | Attending: Urgent Care | Admitting: Urgent Care

## 2022-08-19 ENCOUNTER — Encounter: Payer: Self-pay | Admitting: *Deleted

## 2022-08-19 DIAGNOSIS — R350 Frequency of micturition: Secondary | ICD-10-CM | POA: Diagnosis not present

## 2022-08-19 DIAGNOSIS — N3090 Cystitis, unspecified without hematuria: Secondary | ICD-10-CM | POA: Insufficient documentation

## 2022-08-19 DIAGNOSIS — R109 Unspecified abdominal pain: Secondary | ICD-10-CM | POA: Diagnosis present

## 2022-08-19 LAB — POCT URINALYSIS DIP (MANUAL ENTRY)
Bilirubin, UA: NEGATIVE
Blood, UA: NEGATIVE
Glucose, UA: NEGATIVE mg/dL
Ketones, POC UA: NEGATIVE mg/dL
Leukocytes, UA: NEGATIVE
Nitrite, UA: NEGATIVE
Protein Ur, POC: NEGATIVE mg/dL
Spec Grav, UA: 1.02 (ref 1.010–1.025)
Urobilinogen, UA: 0.2 E.U./dL
pH, UA: 7 (ref 5.0–8.0)

## 2022-08-19 MED ORDER — TIZANIDINE HCL 4 MG PO TABS: 4.0000 mg | ORAL_TABLET | Freq: Every day | ORAL | 0 refills | Status: AC

## 2022-08-19 MED ORDER — NITROFURANTOIN MONOHYD MACRO 100 MG PO CAPS: 100.0000 mg | ORAL_CAPSULE | Freq: Two times a day (BID) | ORAL | 0 refills | Status: AC

## 2022-08-19 MED ORDER — NAPROXEN 375 MG PO TABS: 375.0000 mg | ORAL_TABLET | Freq: Two times a day (BID) | ORAL | 0 refills | Status: AC

## 2022-08-19 NOTE — ED Triage Notes (Signed)
Reports hx frequent UTIs; Onset 10/17 - started with bilat flank pain (L>R). States he's drinking a lot, but "I'm not staying hydrated; my urine keeps going back to dark color". Denies fevers.

## 2022-08-19 NOTE — Discharge Instructions (Addendum)
Please start Macrobid to address an urinary tract infection. Make sure you hydrate very well with plain water and a quantity of 64-80 ounces of water a day.  Please limit drinks that are considered urinary irritants such as soda, sweet tea, coffee, energy drinks, alcohol.  These can worsen your urinary and genital symptoms but also be the source of them.  I will let you know about your urine culture results through MyChart to see if we need to prescribe or change your antibiotics based off of those results.

## 2022-08-19 NOTE — ED Provider Notes (Signed)
[facility]  Note:  This document was prepared using Systems analyst and may include unintentional dictation errors.  MRN: [id number] DOB: 1976/05/12  Subjective:   Bonnie Tran is a 46 y.o. female presenting for 2 to 3-week history of persistent intermittent bilateral flank pain.  Patient has been inconsistent about hydrating.  States that she has a history of recurrent UTIs and pyelonephritis.  The pain that she is experiencing now reminds her of having pyelonephritis.  She has significant concern for this and would like to get antibiotics as the urinalysis does not generally show urinary tract infection but her urine culture is do.  No current facility-administered medications for this encounter.  Current Outpatient Medications:    amphetamine-dextroamphetamine (ADDERALL) 10 MG tablet, Take 10 mg by mouth 2 (two) times daily with a meal., Disp: , Rfl:    citalopram (CELEXA) 10 MG tablet, Take 10 mg by mouth daily., Disp: , Rfl:    clonazePAM (KLONOPIN) 1 MG tablet, Take 0.5-1 mg by mouth daily as needed for anxiety. , Disp: , Rfl:    cyclobenzaprine (FLEXERIL) 10 MG tablet, Take 10 mg by mouth at bedtime., Disp: , Rfl: 0   ibuprofen (ADVIL) 400 MG tablet, Take 1 tablet (400 mg total) by mouth every 6 (six) hours as needed., Disp: 30 tablet, Rfl: 0   levETIRAcetam (KEPPRA) 500 MG tablet, Take 1 tablet (500 mg total) by mouth every 12 (twelve) hours. (Patient not taking: Reported on 03/16/2017), Disp: 60 tablet, Rfl: 0   niMODipine (NIMOTOP) 30 MG capsule, Take 2 capsules (60 mg total) by mouth every 4 (four) hours. (Patient not taking: Reported on 03/16/2017), Disp: 28 capsule, Rfl: 0   oxyCODONE (OXY IR/ROXICODONE) 5 MG immediate release tablet, Take 1 tablet (5 mg total) by mouth every 6 (six) hours as needed for severe pain., Disp: 10 tablet, Rfl: 0   promethazine (PHENERGAN) 25 MG tablet, Take 1 tablet (25 mg total) by mouth every 6 (six) hours as  needed for nausea or vomiting., Disp: 30 tablet, Rfl: 2   topiramate (TOPAMAX) 50 MG tablet, Take 50 mg by mouth daily. , Disp: , Rfl:    No Known Allergies  Past Medical History:  Diagnosis Date   Aneurysm [facility])      Past Surgical History:  Procedure Laterality Date   CRANIOTOMY Left 01/07/2017   Procedure: CRANIOTOMY INTRACRANIAL ANEURYSM FOR CAROTID;  Surgeon: Consuella Lose, MD;  Location[facility];  Service: Neurosurgery;  Laterality: Left;   IR GENERIC HISTORICAL  01/07/2017   IR ANGIO INTRA EXTRACRAN SEL INTERNAL CAROTID BILAT MOD SED 01/07/2017 Consuella Lose, MD [facility]   IR GENERIC HISTORICAL  01/07/2017   IR ANGIO VERTEBRAL SEL VERTEBRAL BILAT MOD SED 01/07/2017 Consuella Lose, MD [facility]   RADIOLOGY WITH ANESTHESIA N/A 01/07/2017   Procedure: RADIOLOGY WITH ANESTHESIA;  Surgeon: Medication Radiologist, MD;  Location: [facility];  Service: Radiology;  Laterality: N/A;    No family history on file.  Social History   Tobacco Use   Smoking status: Never   Smokeless tobacco: Never  Vaping Use   Vaping Use: Some days  Substance Use Topics   Alcohol use: No   Drug use: No    ROS   Objective:   Vitals: BP (!) 144/82 (BP Location: Right Arm)   Pulse 90   Temp 98.1 F (36.7 C) (Oral)   Resp 16   SpO2 99%   Physical Exam Constitutional:  General: She is not in acute distress.    Appearance: Normal appearance. She is well-developed. She is not ill-appearing, toxic-appearing or diaphoretic.  HENT:     Head: Normocephalic and atraumatic.     Nose: Nose normal.     Mouth/Throat:     Mouth: Mucous membranes are moist.  Eyes:     General: No scleral icterus.       Right eye: No discharge.        Left eye: No discharge.     Extraocular Movements: Extraocular movements intact.  Cardiovascular:     Rate and Rhythm: Normal rate.  Pulmonary:     Effort: Pulmonary effort is normal.  Abdominal:     Tenderness: There is no right CVA tenderness or left  CVA tenderness.  Musculoskeletal:       Back:  Skin:    General: Skin is warm and dry.  Neurological:     General: No focal deficit present.     Mental Status: She is alert and oriented to person, place, and time.  Psychiatric:        Mood and Affect: Mood normal.        Behavior: Behavior normal.     Results for orders placed or performed during the hospital encounter of 08/19/22 (from the past 24 hour(s))  POCT urinalysis dipstick     Status: Abnormal   Collection Time: 08/19/22  7:56 PM  Result Value Ref Range   Color, UA yellow yellow   Clarity, UA cloudy (A) clear   Glucose, UA negative negative mg/dL   Bilirubin, UA negative negative   Ketones, POC UA negative negative mg/dL   Spec Grav, UA 5.400 8.676 - 1.025   Blood, UA negative negative   pH, UA 7.0 5.0 - 8.0   Protein Ur, POC negative negative mg/dL   Urobilinogen, UA 0.2 0.2 or 1.0 E.U./dL   Nitrite, UA Negative Negative   Leukocytes, UA Negative Negative    Assessment and Plan :   PDMP not reviewed this encounter.  1. Recurrent cystitis   2. Urinary frequency   3. Left flank pain     Counseled against antibiotic use but patient insisted that she would like some given her experience previously of negative urinalyses and positive urine cultures.  I do not suspect pyelonephritis at all.  Advised that she start hydrating more consistently which she has not been doing.  Ultimately, was agreeable and will have the patient start Macrobid to cover for acute cystitis, urine culture pending.  Counseled patient on potential for adverse effects with medications prescribed/recommended today, ER and return-to-clinic precautions discussed, patient verbalized understanding.   Wallis Bamberg, [place] 08/23/22 [id number]

## 2022-08-21 LAB — URINE CULTURE: Culture: NO GROWTH
